# Patient Record
Sex: Male | Born: 1965 | Race: Black or African American | Hispanic: No | Marital: Married | Smoking: Current some day smoker
Health system: Southern US, Community
[De-identification: ages and names within clinical notes are randomized; demographics above are authoritative.]

## PROBLEM LIST (undated history)

## (undated) DIAGNOSIS — M109 Gout, unspecified: Secondary | ICD-10-CM

## (undated) DIAGNOSIS — I10 Essential (primary) hypertension: Secondary | ICD-10-CM

## (undated) DIAGNOSIS — I82409 Acute embolism and thrombosis of unspecified deep veins of unspecified lower extremity: Secondary | ICD-10-CM

## (undated) DIAGNOSIS — D689 Coagulation defect, unspecified: Secondary | ICD-10-CM

## (undated) DIAGNOSIS — I2699 Other pulmonary embolism without acute cor pulmonale: Secondary | ICD-10-CM

## (undated) HISTORY — DX: Coagulation defect, unspecified: D68.9

## (undated) HISTORY — PX: TENDON REPAIR: SHX5111

---

## 1998-12-25 ENCOUNTER — Emergency Department (HOSPITAL_COMMUNITY): Admission: EM | Admit: 1998-12-25 | Discharge: 1998-12-25 | Payer: Self-pay | Admitting: Emergency Medicine

## 1998-12-25 ENCOUNTER — Encounter: Payer: Self-pay | Admitting: Emergency Medicine

## 2003-04-08 ENCOUNTER — Emergency Department (HOSPITAL_COMMUNITY): Admission: EM | Admit: 2003-04-08 | Discharge: 2003-04-09 | Payer: Self-pay | Admitting: Emergency Medicine

## 2007-06-11 ENCOUNTER — Emergency Department (HOSPITAL_COMMUNITY): Admission: EM | Admit: 2007-06-11 | Discharge: 2007-06-11 | Payer: Self-pay | Admitting: Emergency Medicine

## 2011-08-07 LAB — URINALYSIS, ROUTINE W REFLEX MICROSCOPIC
Bilirubin Urine: NEGATIVE
Glucose, UA: NEGATIVE
Hgb urine dipstick: NEGATIVE
Ketones, ur: NEGATIVE
Nitrite: NEGATIVE
Protein, ur: NEGATIVE
Specific Gravity, Urine: 1.023
Urobilinogen, UA: 1
pH: 6

## 2011-08-07 LAB — DIFFERENTIAL
Basophils Absolute: 0
Basophils Relative: 0
Eosinophils Absolute: 0.2
Eosinophils Relative: 3
Lymphocytes Relative: 26
Lymphs Abs: 2
Monocytes Absolute: 0.6
Monocytes Relative: 8
Neutro Abs: 4.9
Neutrophils Relative %: 63

## 2011-08-07 LAB — BASIC METABOLIC PANEL
BUN: 7
CO2: 27
Calcium: 9.4
Chloride: 105
Creatinine, Ser: 1.11
GFR calc Af Amer: >60
GFR calc non Af Amer: >60
Glucose, Bld: 120 — ABNORMAL HIGH
Potassium: 4.4
Sodium: 142

## 2011-08-07 LAB — RAPID URINE DRUG SCREEN, HOSP PERFORMED
Amphetamines: NOT DETECTED
Barbiturates: NOT DETECTED
Benzodiazepines: NOT DETECTED
Cocaine: POSITIVE — AB
Opiates: NOT DETECTED
Tetrahydrocannabinol: NOT DETECTED

## 2011-08-07 LAB — CBC
HCT: 44.1
Hemoglobin: 14.9
MCHC: 33.8
MCV: 88.9
Platelets: 288
RBC: 4.96
RDW: 12.8
WBC: 7.7

## 2011-08-07 LAB — ETHANOL: Alcohol, Ethyl (B): 5

## 2012-07-03 ENCOUNTER — Emergency Department (HOSPITAL_BASED_OUTPATIENT_CLINIC_OR_DEPARTMENT_OTHER)
Admission: EM | Admit: 2012-07-03 | Discharge: 2012-07-03 | Disposition: A | Discharge status: Home / Self Care | Payer: BC Managed Care – PPO | Attending: Emergency Medicine | Admitting: Emergency Medicine

## 2012-07-03 ENCOUNTER — Emergency Department (HOSPITAL_BASED_OUTPATIENT_CLINIC_OR_DEPARTMENT_OTHER): Payer: BC Managed Care – PPO

## 2012-07-03 ENCOUNTER — Encounter (HOSPITAL_BASED_OUTPATIENT_CLINIC_OR_DEPARTMENT_OTHER): Payer: Self-pay | Admitting: *Deleted

## 2012-07-03 DIAGNOSIS — R42 Dizziness and giddiness: Secondary | ICD-10-CM | POA: Insufficient documentation

## 2012-07-03 DIAGNOSIS — I10 Essential (primary) hypertension: Secondary | ICD-10-CM

## 2012-07-03 DIAGNOSIS — Z79899 Other long term (current) drug therapy: Secondary | ICD-10-CM | POA: Insufficient documentation

## 2012-07-03 DIAGNOSIS — H538 Other visual disturbances: Secondary | ICD-10-CM | POA: Insufficient documentation

## 2012-07-03 DIAGNOSIS — H81399 Other peripheral vertigo, unspecified ear: Secondary | ICD-10-CM

## 2012-07-03 HISTORY — DX: Essential (primary) hypertension: I10

## 2012-07-03 LAB — CBC WITH DIFFERENTIAL/PLATELET
Basophils Absolute: 0.1 10*3/uL (ref 0.0–0.1)
Eosinophils Absolute: 0.4 10*3/uL (ref 0.0–0.7)
Eosinophils Relative: 5 % (ref 0–5)
HCT: 43.7 % (ref 39.0–52.0)
Lymphocytes Relative: 32 % (ref 12–46)
MCH: 30.3 pg (ref 26.0–34.0)
MCV: 87.1 fL (ref 78.0–100.0)
Monocytes Absolute: 0.7 10*3/uL (ref 0.1–1.0)
Platelets: 226 10*3/uL (ref 150–400)
RDW: 12.5 % (ref 11.5–15.5)
WBC: 8.3 10*3/uL (ref 4.0–10.5)

## 2012-07-03 LAB — BASIC METABOLIC PANEL
CO2: 27 mEq/L (ref 19–32)
Calcium: 9.7 mg/dL (ref 8.4–10.5)
Creatinine, Ser: 1 mg/dL (ref 0.50–1.35)
GFR calc non Af Amer: 89 mL/min — ABNORMAL LOW (ref >90)
Glucose, Bld: 95 mg/dL (ref 70–99)
Sodium: 137 mEq/L (ref 135–145)

## 2012-07-03 MED ORDER — MECLIZINE HCL 25 MG PO TABS
25.0000 mg | ORAL_TABLET | Freq: Once | ORAL | Status: AC
  Administered 2012-07-03: 25 mg via ORAL
  Filled 2012-07-03: qty 1

## 2012-07-03 MED ORDER — MECLIZINE HCL 25 MG PO TABS: 25.0000 mg | ORAL_TABLET | Freq: Three times a day (TID) | ORAL | Status: DC | PRN

## 2012-07-03 NOTE — ED Notes (Signed)
Pt states he has felt like he is "drunk" since Friday, but does not drink. Denies other s/s. No problems with speech, but feels like he is stumbling. This a.m. put tissue in ear and states that helped some.

## 2012-07-03 NOTE — ED Provider Notes (Signed)
History   This chart was scribed for Dione Booze, MD by Sofie Rower. The patient was seen in room MH03/MH03 and the patient's care was started at 5:05PM    CSN: [id number]  Arrival date & time 07/03/12  1400   First MD Initiated Contact with Patient 07/03/12 1705      Chief Complaint  Patient presents with  . Dizziness    (Consider location/radiation/quality/duration/timing/severity/associated sxs/prior treatment) The history is provided by the patient. No language interpreter was used.    Luis Harrington is a 46 y.o. male , with a hx of hypertension, who presents to the Emergency Department complaining of sudden, progressively worsening dizziness, onset two days ago. The pt reports the dizzy sensation he is experiencing is a spinning sensation where he feels as if he is constantly off balance. Modifying factors include placing tissue paper in the ears which provides moderate relief of the dizziness, walking which intensifies the dizziness, certain movements and positions of the head which intensifies the dizziness, and taking tylenol which does not provide relief of the dizziness. The pt denies any ear pain or ringing sensation in the ears.   The pt currently quit smoking one month ago. The pt does not drink alcohol.      Past Medical History  Diagnosis Date  . Hypertension     History reviewed. No pertinent past surgical history.  History reviewed. No pertinent family history.  History  Substance Use Topics  . Smoking status: Never Smoker   . Smokeless tobacco: Not on file  . Alcohol Use: No      Review of Systems  All other systems reviewed and are negative.    Allergies  Shrimp  Home Medications   Current Outpatient Rx  Name Route Sig Dispense Refill  . VITAMIN C PO Oral Take 1 tablet by mouth daily.    Marland Kitchen VITAMIN D PO Oral Take 1 tablet by mouth daily.    Marland Kitchen GINSENG PO Oral Take 1 tablet by mouth daily.    Marland Kitchen LISINOPRIL-HYDROCHLOROTHIAZIDE 10-12.5 MG PO TABS  Oral Take 1 tablet by mouth daily.    Marland Kitchen ZINC PO Oral Take 1 tablet by mouth daily.    . TETRAHYDROZOLINE HCL 0.05 % OP SOLN Both Eyes Place 1 drop into both eyes daily.      BP 209/125  Pulse 61  Temp 98.1 F (36.7 C) (Oral)  Resp 20  Ht 5\' 11"  (1.803 m)  Wt 218 lb (98.884 kg)  BMI 30.40 kg/m2  SpO2 100%  Physical Exam  Nursing note and vitals reviewed. Constitutional: He is oriented to person, place, and time. He appears well-developed and well-nourished.  HENT:  Head: Atraumatic.  Nose: Nose normal.  Eyes: Conjunctivae and EOM are normal.  Neck: Normal range of motion. Neck supple.  Cardiovascular: Normal rate, regular rhythm and normal heart sounds.        No carotid bruis.   Pulmonary/Chest: Effort normal and breath sounds normal.  Abdominal: Soft. Bowel sounds are normal.  Musculoskeletal: Normal range of motion.  Neurological: He is alert and oriented to person, place, and time.       On Romberg test, he is unsteady and tends to fall to his right. Dizziness is partially reproduced by vestibular stimulation.  Skin: Skin is warm and dry.  Psychiatric: He has a normal mood and affect. His behavior is normal.    ED Course  Procedures (including critical care time)  DIAGNOSTIC STUDIES: Oxygen Saturation is 100% on room air, normal  by my interpretation.    COORDINATION OF CARE:   5:16PM- Blood work, CT scan, vertigo management, and possible MRI scan discussed. Pt agrees to treatment.   8:06PM- Recheck. Laboratory and radiology results discussed. Pt agrees to treatment.   Results for orders placed during the hospital encounter of 07/03/12  CBC WITH DIFFERENTIAL      Component Value Range   WBC 8.3  4.0 - 10.5 K/uL   RBC 5.02  4.22 - 5.81 MIL/uL   Hemoglobin 15.2  13.0 - 17.0 g/dL   HCT 96.0  45.4 - 09.8 %   MCV 87.1  78.0 - 100.0 fL   MCH 30.3  26.0 - 34.0 pg   MCHC 34.8  30.0 - 36.0 g/dL   RDW 11.9  14.7 - 82.9 %   Platelets 226  150 - 400 K/uL   Neutrophils  Relative 54  43 - 77 %   Neutro Abs 4.5  1.7 - 7.7 K/uL   Lymphocytes Relative 32  12 - 46 %   Lymphs Abs 2.6  0.7 - 4.0 K/uL   Monocytes Relative 8  3 - 12 %   Monocytes Absolute 0.7  0.1 - 1.0 K/uL   Eosinophils Relative 5  0 - 5 %   Eosinophils Absolute 0.4  0.0 - 0.7 K/uL   Basophils Relative 1  0 - 1 %   Basophils Absolute 0.1  0.0 - 0.1 K/uL  BASIC METABOLIC PANEL      Component Value Range   Sodium 137  135 - 145 mEq/L   Potassium 4.2  3.5 - 5.1 mEq/L   Chloride 99  96 - 112 mEq/L   CO2 27  19 - 32 mEq/L   Glucose, Bld 95  70 - 99 mg/dL   BUN 12  6 - 23 mg/dL   Creatinine, Ser 5.62  0.50 - 1.35 mg/dL   Calcium 9.7  8.4 - 13.0 mg/dL   GFR calc non Af Amer 89 (*) >90 mL/min   GFR calc Af Amer >90  >90 mL/min   Ct Head Wo Contrast  07/03/2012  *RADIOLOGY REPORT*  Clinical Data:  Dizziness, nausea and blurred vision.  History of hypertension.  CT HEAD WITHOUT CONTRAST  Technique: Contiguous axial images were obtained from the base of the skull through the vertex without contrast.  Comparison: None.  Findings: Normal appearing cerebral hemispheres and posterior fossa structures.  Normal size and position of the ventricles.  No intracranial hemorrhage, mass lesion or evidence of acute infarction.  Unremarkable bones and included portions of the paranasal sinuses.  IMPRESSION: Normal examination.   Original Report Authenticated By: Darrol Angel, M.D.          1. Peripheral vertigo   2. Hypertension       MDM  Vertigo. It is unclear whether his vertigo is peripheral or central. He has components of both. CT scan will be obtained and if negative, consideration will be given to transferral to [facility] to get an MRI scan. Also of concern is that his hypertension is poorly controlled.   Reexam after oral meclizine, he feels much better. Romberg testing was repeated and is now completely normal. At this point, I do not see an indication for MRI scanning. He'll be sent  home with prescription for meclizine.   I personally performed the services described in this documentation, which was scribed in my presence. The recorded information has been reviewed and considered.      Onalee Hua  Preston Fleeting, MD 07/03/12 2011

## 2012-07-05 ENCOUNTER — Encounter (HOSPITAL_BASED_OUTPATIENT_CLINIC_OR_DEPARTMENT_OTHER): Payer: Self-pay

## 2012-07-05 ENCOUNTER — Emergency Department (HOSPITAL_BASED_OUTPATIENT_CLINIC_OR_DEPARTMENT_OTHER)
Admission: EM | Admit: 2012-07-05 | Discharge: 2012-07-06 | Disposition: A | Discharge status: Home / Self Care | Payer: BC Managed Care – PPO | Attending: Emergency Medicine | Admitting: Emergency Medicine

## 2012-07-05 DIAGNOSIS — H9319 Tinnitus, unspecified ear: Secondary | ICD-10-CM | POA: Insufficient documentation

## 2012-07-05 DIAGNOSIS — R42 Dizziness and giddiness: Secondary | ICD-10-CM | POA: Insufficient documentation

## 2012-07-05 DIAGNOSIS — H81399 Other peripheral vertigo, unspecified ear: Secondary | ICD-10-CM

## 2012-07-05 DIAGNOSIS — I10 Essential (primary) hypertension: Secondary | ICD-10-CM | POA: Insufficient documentation

## 2012-07-05 NOTE — ED Notes (Signed)
Pt right ear is swollen and is red inside.

## 2012-07-05 NOTE — ED Notes (Signed)
C/o ringing in right ear, dizziness x 5 days-was seen here [facility] Link for same

## 2012-07-05 NOTE — ED Provider Notes (Signed)
History     CSN: [id number] Arrival date & time 07/05/12  2217 First MD Initiated Contact with Patient 07/05/12 2342      Chief Complaint  Patient presents with  . Tinnitus    HPI Pt has been having issues for the last week.  He thinks he has an ear infection.  PT denies any pain.  He has been having trouble with ringing in his ears and some issues with his balance.  Pt has had some nasal congestion and drainage down the back of his throat.  He was seen in the ED about on Sunday and had an evaluation including a CT scan.  It was normal and he was treated for vertigo.  Pt has been taking the meclizine but is not sure if it helps.  No trouble with speech or coordination although his balance does feel off.  The room is no longer spinning but it was earlier.  His balance has been better since the medications. Past Medical History  Diagnosis Date  . Hypertension     History reviewed. No pertinent past surgical history.  No family history on file.  History  Substance Use Topics  . Smoking status: Never Smoker   . Smokeless tobacco: Not on file  . Alcohol Use: No      Review of Systems  Allergies  Shrimp  Home Medications   Current Outpatient Rx  Name Route Sig Dispense Refill  . VITAMIN C PO Oral Take 1 tablet by mouth daily.    Marland Kitchen VITAMIN D PO Oral Take 1 tablet by mouth daily.    Marland Kitchen GINSENG PO Oral Take 1 tablet by mouth daily.    Marland Kitchen LISINOPRIL-HYDROCHLOROTHIAZIDE 10-12.5 MG PO TABS Oral Take 1 tablet by mouth daily.    Marland Kitchen MECLIZINE HCL 25 MG PO TABS Oral Take 25 mg by mouth 3 (three) times daily as needed. For vertigo.    Marland Kitchen ZINC PO Oral Take 1 tablet by mouth daily.    . TETRAHYDROZOLINE HCL 0.05 % OP SOLN Both Eyes Place 1 drop into both eyes daily.      BP 142/100  Pulse 88  Temp 98.2 F (36.8 C) (Oral)  Resp 16  Ht 5\' 11"  (1.803 m)  Wt 218 lb (98.884 kg)  BMI 30.40 kg/m2  SpO2 100%  Physical Exam  Nursing note and vitals reviewed. Constitutional: He is  oriented to person, place, and time. He appears well-developed and well-nourished. No distress.  HENT:  Head: Normocephalic and atraumatic.  Right Ear: Tympanic membrane, external ear and ear canal normal.  Left Ear: Tympanic membrane, external ear and ear canal normal.  Mouth/Throat: Oropharynx is clear and moist.  Eyes: Conjunctivae normal are normal. Right eye exhibits no discharge. Left eye exhibits no discharge. No scleral icterus.  Neck: Neck supple. No tracheal deviation present.  Cardiovascular: Normal rate, regular rhythm and intact distal pulses.   Pulmonary/Chest: Effort normal and breath sounds normal. No stridor. No respiratory distress. He has no wheezes. He has no rales.  Abdominal: Soft. Bowel sounds are normal. He exhibits no distension. There is no tenderness. There is no rebound and no guarding.  Musculoskeletal: He exhibits no edema and no tenderness.  Neurological: He is alert and oriented to person, place, and time. He has normal strength. No cranial nerve deficit ( no gross defecits noted) or sensory deficit. He exhibits normal muscle tone. He displays no seizure activity. Coordination and gait normal.       No pronator drift bilateral  upper extrem, able to hold both legs off bed for 5 seconds, sensation intact in all extremities, no visual field cuts, no left or right sided neglect  Skin: Skin is warm and dry. No rash noted.  Psychiatric: He has a normal mood and affect.    ED Course  Procedures (including critical care time)  Labs Reviewed - No data to display No results found.   1. Peripheral vertigo   2. Tinnitus       MDM  I suspect the patient's symptoms are related to peripheral vertigo.  He has a normal neurologic exam I doubt stroke. I did recommend followup with an ENT doctor for further evaluation.  At this time there does not appear to be any evidence of an acute emergency medical condition and the patient appears stable for discharge with  appropriate outpatient follow up.        Celene Kras, MD 07/06/12 [phone]

## 2013-09-08 IMAGING — CT CT HEAD W/O CM
1 series · 16 of 30 positions shown, 20 images · non-contrast
Comparison: None.

CLINICAL DATA: Dizziness, nausea and blurred vision.  History of
hypertension.

CT HEAD WITHOUT CONTRAST
TECHNIQUE: Contiguous axial images were obtained from the base of
the skull through the vertex without contrast.

[Series 2: head 4.8 h37s · axial · 0.47mm/px · z∈[-136,+20]mm · 16 of 36 slices shown, 20 images]
[im 2/36  brain]
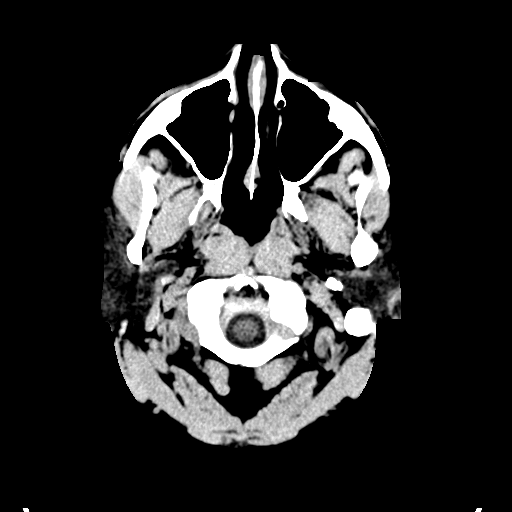
[im 2/36  bone]
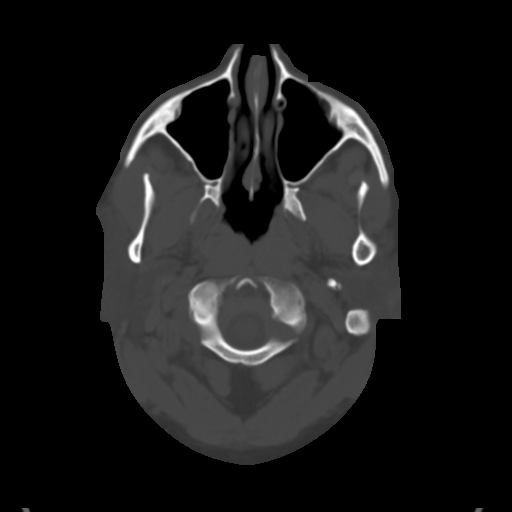
[im 4/36  brain]
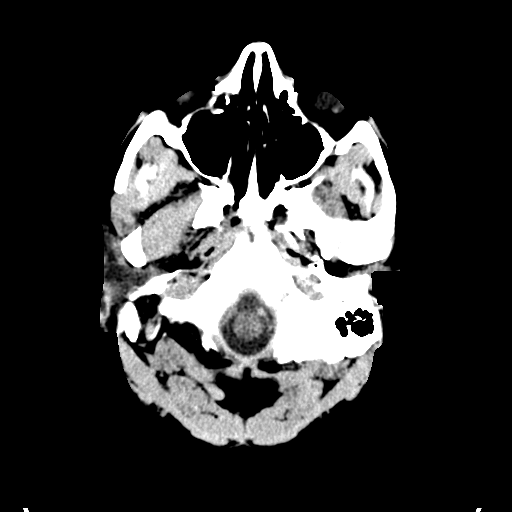
[im 7/36  brain]
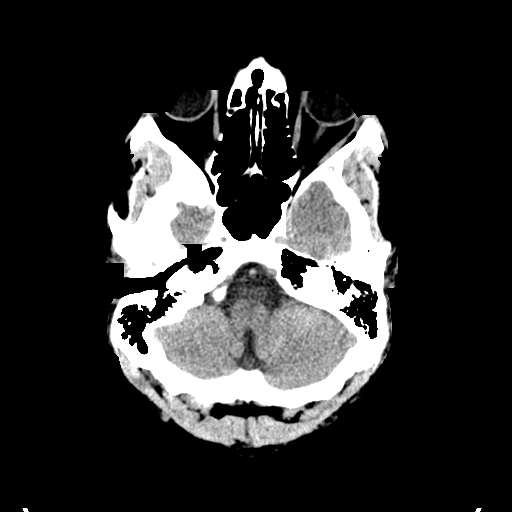
[im 9/36  brain]
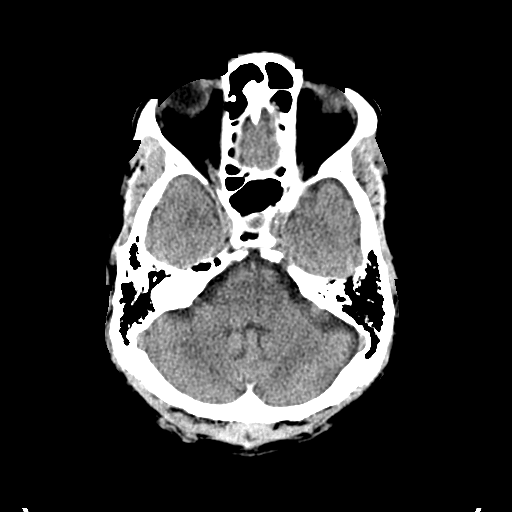
[im 10/36  brain]
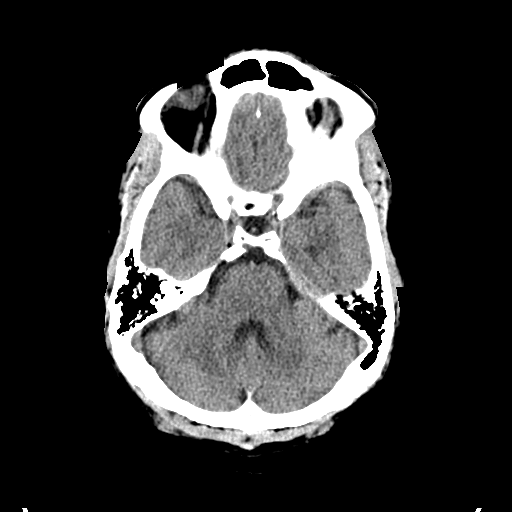
[im 10/36  bone]
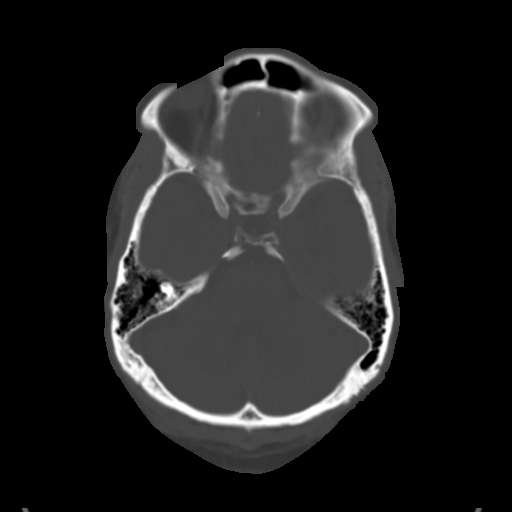
[im 13/36  brain]
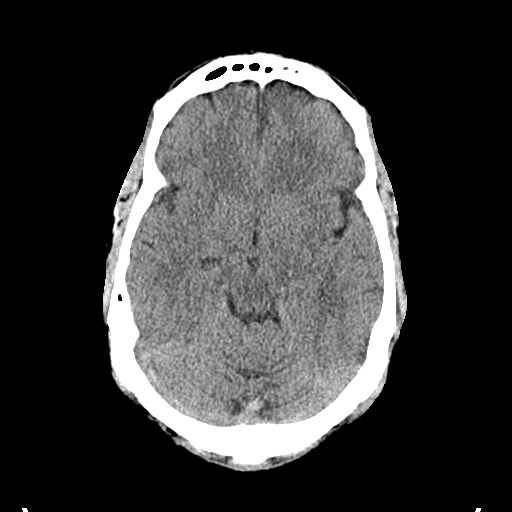
[im 15/36  brain]
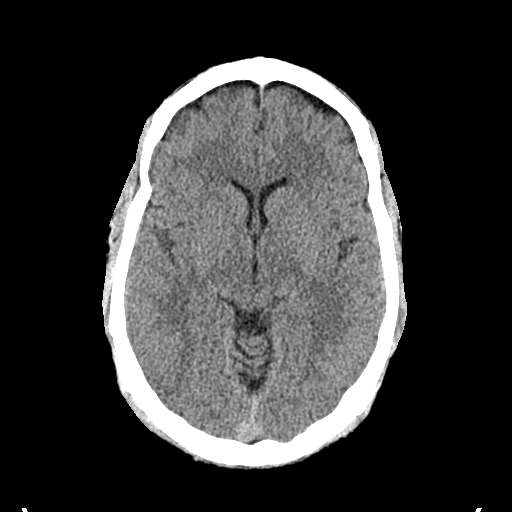
[im 17/36  brain]
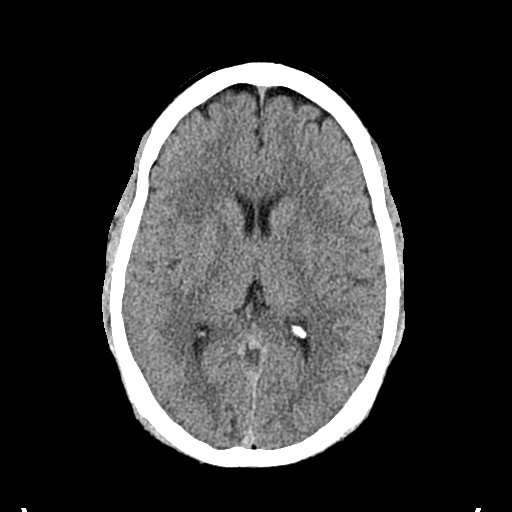
[im 19/36  brain]
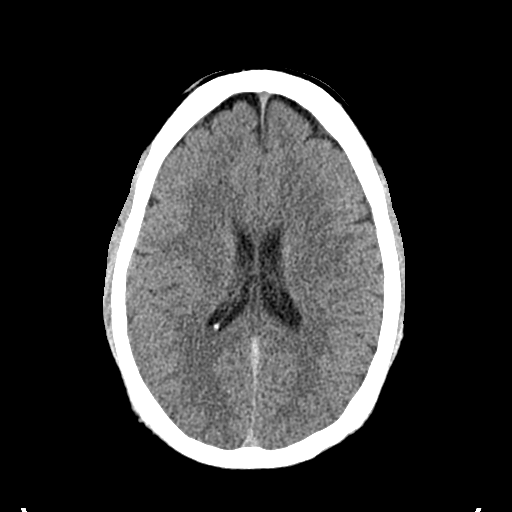
[im 19/36  bone]
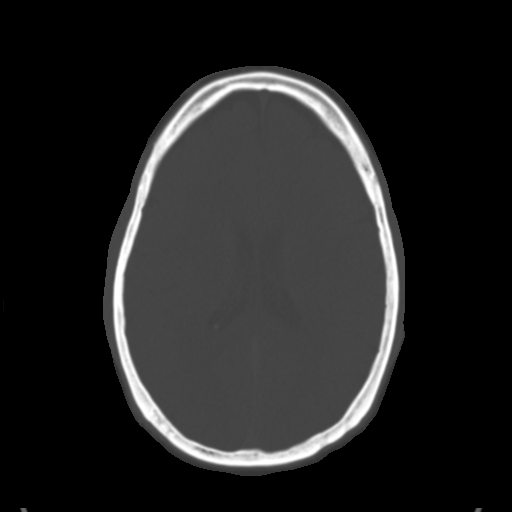
[im 21/36  brain]
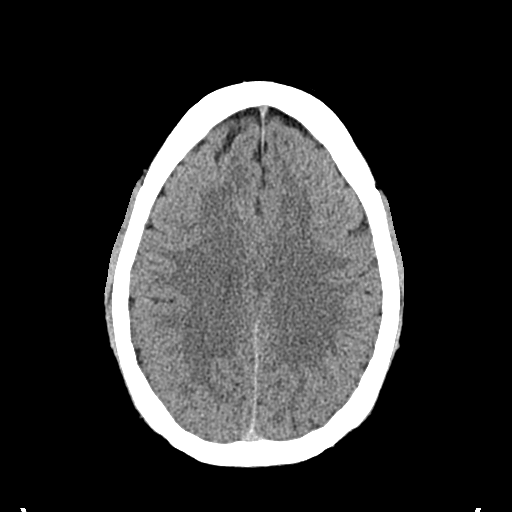
[im 23/36  brain]
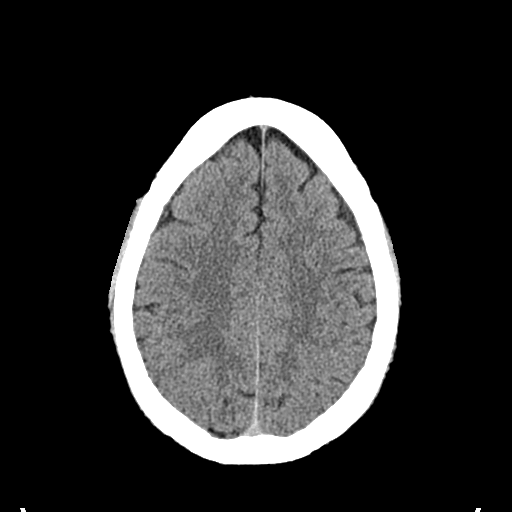
[im 26/36  brain]
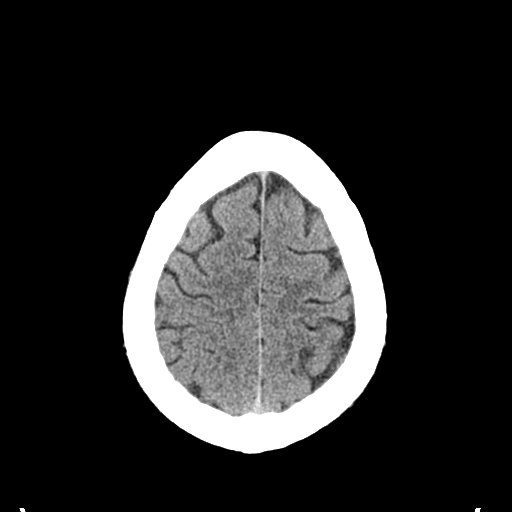
[im 27/36  brain]
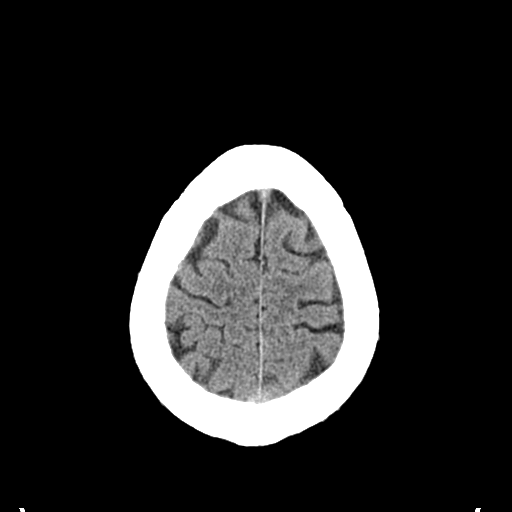
[im 27/36  bone]
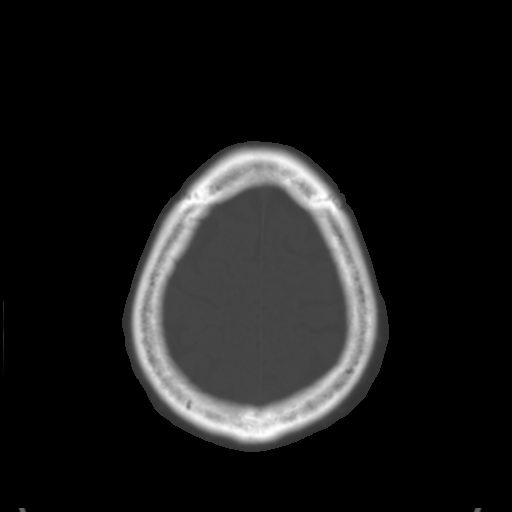
[im 29/36  brain]
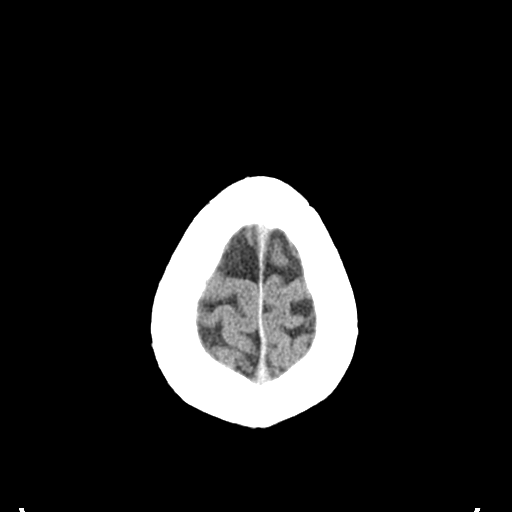
[im 32/36  brain]
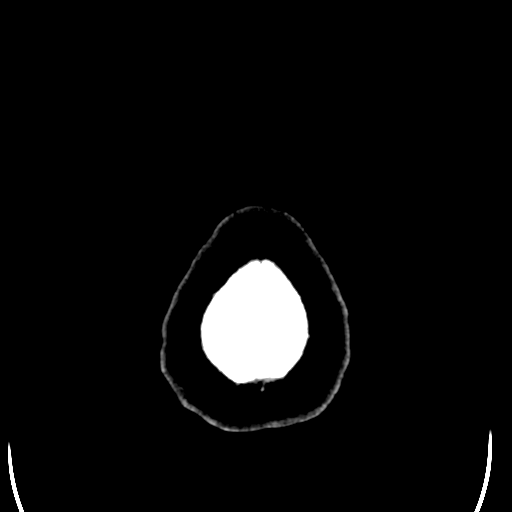
[im 34/36  brain]
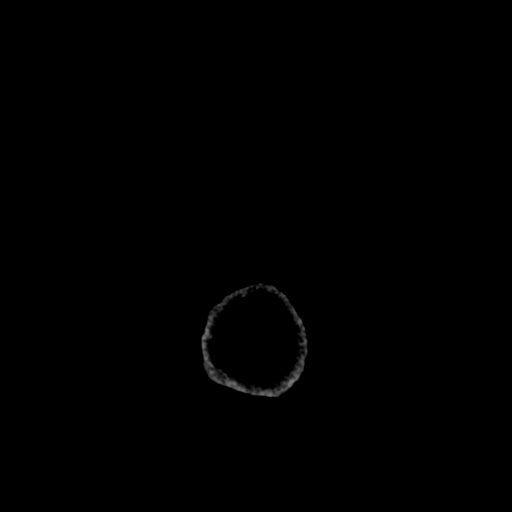

[16 of 30 positions shown; findings below may reference images not displayed]

FINDINGS: Normal appearing cerebral hemispheres and posterior fossa
structures.  Normal size and position of the ventricles.  No
intracranial hemorrhage, mass lesion or evidence of acute
infarction.  Unremarkable bones and included portions of the
paranasal sinuses.
IMPRESSION: Normal examination.

## 2017-01-11 LAB — BASIC METABOLIC PANEL
BUN: 25 — AB (ref 4–21)
Creatinine: 1.7 — AB (ref 0.6–1.3)
GLUCOSE: 118
SODIUM: 135 — AB (ref 137–147)

## 2017-01-11 LAB — CBC AND DIFFERENTIAL: WBC: 7.7

## 2017-01-11 LAB — PSA: PSA: 4.76

## 2018-04-02 ENCOUNTER — Telehealth (HOSPITAL_COMMUNITY): Payer: Self-pay

## 2018-04-02 ENCOUNTER — Ambulatory Visit (HOSPITAL_COMMUNITY)
Admission: EM | Admit: 2018-04-02 | Discharge: 2018-04-02 | Disposition: A | Discharge status: Home / Self Care | Payer: BLUE CROSS/BLUE SHIELD | Attending: Family | Admitting: Family

## 2018-04-02 ENCOUNTER — Other Ambulatory Visit: Payer: Self-pay

## 2018-04-02 ENCOUNTER — Ambulatory Visit (INDEPENDENT_AMBULATORY_CARE_PROVIDER_SITE_OTHER): Payer: BLUE CROSS/BLUE SHIELD

## 2018-04-02 ENCOUNTER — Encounter (HOSPITAL_COMMUNITY): Payer: Self-pay

## 2018-04-02 DIAGNOSIS — M109 Gout, unspecified: Secondary | ICD-10-CM

## 2018-04-02 DIAGNOSIS — M79672 Pain in left foot: Secondary | ICD-10-CM | POA: Diagnosis not present

## 2018-04-02 LAB — POCT I-STAT, CHEM 8
BUN: 13 mg/dL (ref 6–20)
CHLORIDE: 103 mmol/L (ref 101–111)
Calcium, Ion: 1.24 mmol/L (ref 1.15–1.40)
Creatinine, Ser: 1.2 mg/dL (ref 0.61–1.24)
Glucose, Bld: 113 mg/dL — ABNORMAL HIGH (ref 65–99)
HEMATOCRIT: 45 % (ref 39.0–52.0)
HEMOGLOBIN: 15.3 g/dL (ref 13.0–17.0)
POTASSIUM: 4.1 mmol/L (ref 3.5–5.1)
Sodium: 140 mmol/L (ref 135–145)
TCO2: 26 mmol/L (ref 22–32)

## 2018-04-02 MED ORDER — AMLODIPINE BESYLATE 5 MG PO TABS: 5.0000 mg | ORAL_TABLET | Freq: Every day | ORAL | 0 refills | Status: DC

## 2018-04-02 MED ORDER — COLCHICINE 0.6 MG PO TABS: ORAL_TABLET | ORAL | 0 refills | Status: DC

## 2018-04-02 NOTE — ED Triage Notes (Signed)
Pt presents today with left foot pain that has been going on since Thursday. States that it is swollen and throbbing and he thinks it is gout. Has never had gout before. State that earlier during the week he did step on a vacuum at the car wash and twisted ankle but it did not bother him.

## 2018-04-02 NOTE — ED Provider Notes (Signed)
[facility]    CSN: [id number] Arrival date & time: 04/02/18  1758     History   Chief Complaint Chief Complaint  Patient presents with  . Foot Pain    HPI Luis Harrington is a 52 y.o. male.   Chief complaint of left foot pain since Thursday, unchanged. Painful to walk. Pain occurred sudden on Thursday. all the sudden pain.   No rash, laceration,  Leg swelling.  6 days ago twisted left ankle on vacuum, however pain at first after incident, then resolved.   Drinks small bottle brandy per day. Tried celery , black berry juice without relief. No history of gout   HTN- Not taking lisinopril-hctz for couple of months. Tried to treat HTN holistically. Takes garlic , ginseng and felt like blood pressure was controlled. Top number runs in 160s.  Denies exertional chest pain or pressure, numbness or tingling radiating to left arm or jaw, palpitations, dizziness, frequent headaches, changes in vision, or shortness of breath.       Past Medical History:  Diagnosis Date  . Hypertension     There are no active problems to display for this patient.   History reviewed. No pertinent surgical history.     Home Medications    Prior to Admission medications   Medication Sig Start Date End Date Taking? Authorizing Provider  GINSENG PO Take 1 tablet by mouth daily.   Yes [provider]  amLODipine (NORVASC) 5 MG tablet Take 1 tablet (5 mg total) by mouth daily. 04/02/18   Allegra Grana, FNP  Ascorbic Acid (VITAMIN C PO) Take 1 tablet by mouth daily.    [provider]  Cholecalciferol (VITAMIN D PO) Take 1 tablet by mouth daily.    [provider]  colchicine 0.6 MG tablet Take 1.2 mg PO, one hour later by another 0.6 mg on first day; then 0.6 mg twice daily until attack resolved.1 04/02/18   Allegra Grana, FNP  Multiple Vitamins-Minerals (ZINC PO) Take 1 tablet by mouth daily.    [provider]  tetrahydrozoline (VISINE) 0.05 %  ophthalmic solution Place 1 drop into both eyes daily.    [provider]    Family History History reviewed. No pertinent family history.  Social History Social History   Tobacco Use  . Smoking status: Never Smoker  Substance Use Topics  . Alcohol use: No  . Drug use: No     Allergies   Shrimp [shellfish allergy]   Review of Systems Review of Systems  Constitutional: Negative for chills and fever.  Eyes: Negative for visual disturbance.  Respiratory: Negative for cough and shortness of breath.   Cardiovascular: Negative for chest pain and palpitations.  Gastrointestinal: Negative for nausea and vomiting.  Musculoskeletal: Negative for joint swelling.  Skin: Negative for rash and wound.  Neurological: Negative for dizziness and headaches.     Physical Exam Triage Vital Signs ED Triage Vitals  Enc Vitals Group     BP 04/02/18 1828 (!) 241/144     Pulse Rate 04/02/18 1828 96     Resp 04/02/18 1828 16     Temp 04/02/18 1828 99.3 F (37.4 C)     Temp Source 04/02/18 1828 Oral     SpO2 04/02/18 1828 100 %     Weight --      Height --      Head Circumference --      Peak Flow --      Pain Score 04/02/18  1825 8     Pain Loc --      Pain Edu? --      Excl. in GC? --    No data found.  Updated Vital Signs BP (!) 241/144 (BP Location: Left Arm)   Pulse 96   Temp 99.3 F (37.4 C) (Oral)   Resp 16   SpO2 100%   Visual Acuity Right Eye Distance:   Left Eye Distance:   Bilateral Distance:    Right Eye Near:   Left Eye Near:    Bilateral Near:     Physical Exam  Constitutional: He appears well-developed and well-nourished.  HENT:  Right Ear: Hearing normal.  Left Ear: Hearing normal.  Mouth/Throat: Uvula is midline, oropharynx is clear and moist and mucous membranes are normal. No posterior oropharyngeal edema or posterior oropharyngeal erythema.  Eyes: Pupils are equal, round, and reactive to light. Conjunctivae, EOM and lids are normal. Lids  are everted and swept, no foreign bodies found.  Normal fundus bilaterally.  Cardiovascular: Regular rhythm and normal heart sounds.  No LE edema, palpable cords or masses. No erythema or increased warmth. No asymmetry in calf size when compared bilaterally LE hair growth symmetric and present. No discoloration of varicosities noted. LE warm and palpable pedal pulses.   Pulmonary/Chest: Effort normal and breath sounds normal. No respiratory distress. He has no wheezes. He has no rhonchi. He has no rales.  Musculoskeletal:       Left ankle: He exhibits normal range of motion, no swelling and no ecchymosis. No tenderness.       Feet:  Severe point tenderness proximal to great toe. No erythema, increased warmth, edema. SKin intact. NO rash  Lymphadenopathy:       Head (right side): No submental, no submandibular, no tonsillar, no preauricular, no posterior auricular and no occipital adenopathy present.       Head (left side): No submental, no submandibular, no tonsillar, no preauricular, no posterior auricular and no occipital adenopathy present.    He has no cervical adenopathy.  Neurological: He is alert. He has normal strength. No cranial nerve deficit or sensory deficit. He displays a negative Romberg sign.  Reflex Scores:      Bicep reflexes are 2+ on the right side and 2+ on the left side.      Patellar reflexes are 2+ on the right side and 2+ on the left side. Grip equal and strong bilateral upper extremities. Gait strong and steady. Able to perform rapid alternating movement without difficulty.  Skin: Skin is warm and dry.  Psychiatric: He has a normal mood and affect. His speech is normal and behavior is normal.  Vitals reviewed.    UC Treatments / Results  Labs (all labs ordered are listed, but only abnormal results are displayed) Labs Reviewed  POCT I-STAT, CHEM 8 - Abnormal; Notable for the following components:      Result Value   Glucose, Bld 113 (*)    All other  components within normal limits    EKG None  Radiology Dg Foot Complete Left  Result Date: 04/02/2018 CLINICAL DATA:  Twisted left ankle on Monday with pain and swelling. EXAM: LEFT FOOT - COMPLETE 3+ VIEW COMPARISON:  None. FINDINGS: There is no evidence of fracture or dislocation. There is small plantar calcaneal spur. Soft tissues are unremarkable. IMPRESSION: No acute fracture or dislocation. Electronically Signed   By: Sherian Rein M.D.   On: 04/02/2018 19:55    Procedures Procedures (including critical care  time)  Medications Ordered in UC Medications - No data to display  Initial Impression / Assessment and Plan / UC Course  I have reviewed the triage vital signs and the nursing notes.  Pertinent labs & imaging results that were available during my care of the patient were reviewed by me and considered in my medical decision making (see chart for details).      Final Clinical Impressions(s) / UC Diagnoses   Final diagnoses:  Gout of left foot, unspecified cause, unspecified chronicity  No signs or symptoms of hypertensive urgency or emergency at this time.  Reassured by neurologic exam.  Patient has been off her blood pressure medications for couple months.  Long discussion with patient about his risk for stroke and heart attack this evening.  He agreed to start amlodipine and he will reestablish with primary care. Based on sudden onset, alcohol use, working diagnosis of gout. No fracture seen on XR left foot.  Start colchicine. Given crutches. Declines medication for pain.  Return precautions given.      Discharge Instructions     Suspect gout.  Concern for blood pressure; very important for you to start amlodipine. I discontinued the lisinopril/hctz since you are not taking Monitor blood pressure,  Goal is less than 130/80; if persistently higher, please follow with primary care ( need to establish) for medication adjustment Low salt Any concern for heart attack  or stroke as we discussed, please call 911 or go directly to emergency room.      ED Prescriptions    Medication Sig Dispense Auth. Provider   amLODipine (NORVASC) 5 MG tablet Take 1 tablet (5 mg total) by mouth daily. 90 tablet Allegra GranaArnett, Alin Chavira G, FNP   colchicine 0.6 MG tablet Take 1.2 mg PO, one hour later by another 0.6 mg on first day; then 0.6 mg twice daily until attack resolved.1 11 tablet Allegra GranaArnett, Shianne Zeiser G, FNP     Controlled Substance Prescriptions [place] Controlled Substance Registry consulted? Not Applicable   Allegra Granarnett, Carlyann Placide G, FNP 04/02/18 2011

## 2018-04-02 NOTE — Discharge Instructions (Addendum)
Suspect gout.  Concern for blood pressure; very important for you to start amlodipine. I discontinued the lisinopril/hctz since you are not taking Monitor blood pressure,  Goal is less than 130/80; if persistently higher, please follow with primary care ( need to establish) for medication adjustment Low salt Any concern for heart attack or stroke as we discussed, please call 911 or go directly to emergency room.

## 2018-04-02 NOTE — ED Notes (Addendum)
fnp aware of blood pressure.  Pt denies any symptoms and reports being off his medication for a long time. Medication refilled and patient reports he is going to get it and take it as soon as he gets home. Encouraged patient to go to the Emergency Department for any numbness, weakness, severe headache, blurry vision or trouble with speech. Patient reports plan to follow up with a PCP

## 2018-04-05 ENCOUNTER — Ambulatory Visit: Payer: Self-pay | Admitting: Podiatry

## 2018-04-06 ENCOUNTER — Encounter: Payer: Self-pay | Admitting: Family Medicine

## 2018-04-06 ENCOUNTER — Ambulatory Visit: Payer: BLUE CROSS/BLUE SHIELD | Admitting: Family Medicine

## 2018-04-06 VITALS — BP 138/88 | HR 82 | Temp 98.5°F | Ht 71.0 in | Wt 230.0 lb

## 2018-04-06 DIAGNOSIS — I10 Essential (primary) hypertension: Secondary | ICD-10-CM | POA: Diagnosis not present

## 2018-04-06 DIAGNOSIS — Z1211 Encounter for screening for malignant neoplasm of colon: Secondary | ICD-10-CM

## 2018-04-06 DIAGNOSIS — Z13220 Encounter for screening for lipoid disorders: Secondary | ICD-10-CM | POA: Diagnosis not present

## 2018-04-06 DIAGNOSIS — R739 Hyperglycemia, unspecified: Secondary | ICD-10-CM

## 2018-04-06 DIAGNOSIS — M109 Gout, unspecified: Secondary | ICD-10-CM

## 2018-04-06 LAB — LIPID PANEL
CHOLESTEROL: 186 mg/dL (ref 0–200)
HDL: 30.6 mg/dL — AB (ref >39.00)
LDL CALC: 128 mg/dL — AB (ref 0–99)
NonHDL: 155.84
Total CHOL/HDL Ratio: 6
Triglycerides: 139 mg/dL (ref 0.0–149.0)
VLDL: 27.8 mg/dL (ref 0.0–40.0)

## 2018-04-06 LAB — COMPREHENSIVE METABOLIC PANEL
ALT: 34 U/L (ref 0–53)
AST: 29 U/L (ref 0–37)
Albumin: 4.4 g/dL (ref 3.5–5.2)
Alkaline Phosphatase: 72 U/L (ref 39–117)
BILIRUBIN TOTAL: 0.5 mg/dL (ref 0.2–1.2)
BUN: 12 mg/dL (ref 6–23)
CALCIUM: 10.2 mg/dL (ref 8.4–10.5)
CO2: 29 meq/L (ref 19–32)
Chloride: 101 mEq/L (ref 96–112)
Creatinine, Ser: 1.04 mg/dL (ref 0.40–1.50)
GFR: 96.63 mL/min (ref >60.00)
GLUCOSE: 127 mg/dL — AB (ref 70–99)
Potassium: 4.6 mEq/L (ref 3.5–5.1)
Sodium: 137 mEq/L (ref 135–145)
Total Protein: 7.9 g/dL (ref 6.0–8.3)

## 2018-04-06 LAB — CBC
HCT: 45.3 % (ref 39.0–52.0)
HEMOGLOBIN: 15.1 g/dL (ref 13.0–17.0)
MCHC: 33.3 g/dL (ref 30.0–36.0)
MCV: 93.4 fl (ref 78.0–100.0)
Platelets: 309 10*3/uL (ref 150.0–400.0)
RBC: 4.85 Mil/uL (ref 4.22–5.81)
RDW: 12.9 % (ref 11.5–15.5)
WBC: 6.2 10*3/uL (ref 4.0–10.5)

## 2018-04-06 LAB — URIC ACID: URIC ACID, SERUM: 5.9 mg/dL (ref 4.0–7.8)

## 2018-04-06 LAB — HEMOGLOBIN A1C: Hgb A1c MFr Bld: 5.7 % (ref 4.6–6.5)

## 2018-04-06 LAB — TSH: TSH: 1.3 u[IU]/mL (ref 0.35–4.50)

## 2018-04-06 NOTE — Patient Instructions (Signed)
It was very nice to see you today!  I am glad you are feeling better!  We will check blood work today.  Keep an eye on your blood pressure and let me know if your pressure is persistently 140/90 or higher.   Come back to see me in 3-6 months, or sooner as needed.  Take care, Dr Jimmey Ralph   [facility] Eating Plan DASH stands for "Dietary Approaches to Stop Hypertension." The DASH eating plan is a healthy eating plan that has been shown to reduce high blood pressure (hypertension). It may also reduce your risk for type 2 diabetes, heart disease, and stroke. The DASH eating plan may also help with weight loss. What are tips for following this plan? General guidelines  Avoid eating more than 2,300 mg (milligrams) of salt (sodium) a day. If you have hypertension, you may need to reduce your sodium intake to 1,500 mg a day.  Limit alcohol intake to no more than 1 drink a day for nonpregnant women and 2 drinks a day for men. One drink equals 12 oz of beer, 5 oz of wine, or 1 oz of hard liquor.  Work with your health care provider to maintain a healthy body weight or to lose weight. Ask what an ideal weight is for you.  Get at least 30 minutes of exercise that causes your heart to beat faster (aerobic exercise) most days of the week. Activities may include walking, swimming, or biking.  Work with your health care provider or diet and nutrition specialist (dietitian) to adjust your eating plan to your individual calorie needs. Reading food labels  Check food labels for the amount of sodium per serving. Choose foods with less than 5 percent of the Daily Value of sodium. Generally, foods with less than 300 mg of sodium per serving fit into this eating plan.  To find whole grains, look for the word "whole" as the first word in the ingredient list. Shopping  Buy products labeled as "low-sodium" or "no salt added."  Buy fresh foods. Avoid canned foods and premade or frozen meals. Cooking  Avoid  adding salt when cooking. Use salt-free seasonings or herbs instead of table salt or sea salt. Check with your health care provider or pharmacist before using salt substitutes.  Do not fry foods. Cook foods using healthy methods such as baking, boiling, grilling, and broiling instead.  Cook with heart-healthy oils, such as olive, canola, soybean, or sunflower oil. Meal planning   Eat a balanced diet that includes: ? 5 or more servings of fruits and vegetables each day. At each meal, try to fill half of your plate with fruits and vegetables. ? Up to 6-8 servings of whole grains each day. ? Less than 6 oz of lean meat, poultry, or fish each day. A 3-oz serving of meat is about the same size as a deck of cards. One egg equals 1 oz. ? 2 servings of low-fat dairy each day. ? A serving of nuts, seeds, or beans 5 times each week. ? Heart-healthy fats. Healthy fats called Omega-3 fatty acids are found in foods such as flaxseeds and coldwater fish, like sardines, salmon, and mackerel.  Limit how much you eat of the following: ? Canned or prepackaged foods. ? Food that is high in trans fat, such as fried foods. ? Food that is high in saturated fat, such as fatty meat. ? Sweets, desserts, sugary drinks, and other foods with added sugar. ? Full-fat dairy products.  Do not salt foods  before eating.  Try to eat at least 2 vegetarian meals each week.  Eat more home-cooked food and less restaurant, buffet, and fast food.  When eating at a restaurant, ask that your food be prepared with less salt or no salt, if possible. What foods are recommended? The items listed may not be a complete list. Talk with your dietitian about what dietary choices are best for you. Grains Whole-grain or whole-wheat bread. Whole-grain or whole-wheat pasta. Brown rice. Orpah Cobb. Bulgur. Whole-grain and low-sodium cereals. Pita bread. Low-fat, low-sodium crackers. Whole-wheat flour tortillas. Vegetables Fresh or  frozen vegetables (raw, steamed, roasted, or grilled). Low-sodium or reduced-sodium tomato and vegetable juice. Low-sodium or reduced-sodium tomato sauce and tomato paste. Low-sodium or reduced-sodium canned vegetables. Fruits All fresh, dried, or frozen fruit. Canned fruit in natural juice (without added sugar). Meat and other protein foods Skinless chicken or [place]. Ground chicken or [place]. Pork with fat trimmed off. Fish and seafood. Egg whites. Dried beans, peas, or lentils. Unsalted nuts, nut butters, and seeds. Unsalted canned beans. Lean cuts of beef with fat trimmed off. Low-sodium, lean deli meat. Dairy Low-fat (1%) or fat-free (skim) milk. Fat-free, low-fat, or reduced-fat cheeses. Nonfat, low-sodium ricotta or cottage cheese. Low-fat or nonfat yogurt. Low-fat, low-sodium cheese. Fats and oils Soft margarine without trans fats. Vegetable oil. Low-fat, reduced-fat, or light mayonnaise and salad dressings (reduced-sodium). Canola, safflower, olive, soybean, and sunflower oils. Avocado. Seasoning and other foods Herbs. Spices. Seasoning mixes without salt. Unsalted popcorn and pretzels. Fat-free sweets. What foods are not recommended? The items listed may not be a complete list. Talk with your dietitian about what dietary choices are best for you. Grains Baked goods made with fat, such as croissants, muffins, or some breads. Dry pasta or rice meal packs. Vegetables Creamed or fried vegetables. Vegetables in a cheese sauce. Regular canned vegetables (not low-sodium or reduced-sodium). Regular canned tomato sauce and paste (not low-sodium or reduced-sodium). Regular tomato and vegetable juice (not low-sodium or reduced-sodium). Rosita Fire. Olives. Fruits Canned fruit in a light or heavy syrup. Fried fruit. Fruit in cream or butter sauce. Meat and other protein foods Fatty cuts of meat. Ribs. Fried meat. Tomasa Blase. Sausage. Bologna and other processed lunch meats. Salami. Fatback. Hotdogs.  Bratwurst. Salted nuts and seeds. Canned beans with added salt. Canned or smoked fish. Whole eggs or egg yolks. Chicken or [place] with skin. Dairy Whole or 2% milk, cream, and half-and-half. Whole or full-fat cream cheese. Whole-fat or sweetened yogurt. Full-fat cheese. Nondairy creamers. Whipped toppings. Processed cheese and cheese spreads. Fats and oils Butter. Stick margarine. Lard. Shortening. Ghee. Bacon fat. Tropical oils, such as coconut, palm kernel, or palm oil. Seasoning and other foods Salted popcorn and pretzels. Onion salt, garlic salt, seasoned salt, table salt, and sea salt. Worcestershire sauce. Tartar sauce. Barbecue sauce. Teriyaki sauce. Soy sauce, including reduced-sodium. Steak sauce. Canned and packaged gravies. Fish sauce. Oyster sauce. Cocktail sauce. Horseradish that you find on the shelf. Ketchup. Mustard. Meat flavorings and tenderizers. Bouillon cubes. Hot sauce and Tabasco sauce. Premade or packaged marinades. Premade or packaged taco seasonings. Relishes. Regular salad dressings. Where to find more information:  National Heart, Lung, and Blood Institute: PopSteam.is  American Heart Association: www.heart.org Summary  The DASH eating plan is a healthy eating plan that has been shown to reduce high blood pressure (hypertension). It may also reduce your risk for type 2 diabetes, heart disease, and stroke.  With the DASH eating plan, you should limit salt (sodium) intake to 2,300 mg  a day. If you have hypertension, you may need to reduce your sodium intake to 1,500 mg a day.  When on the DASH eating plan, aim to eat more fresh fruits and vegetables, whole grains, lean proteins, low-fat dairy, and heart-healthy fats.  Work with your health care provider or diet and nutrition specialist (dietitian) to adjust your eating plan to your individual calorie needs. This information is not intended to replace advice given to you by your health care provider. Make sure you  discuss any questions you have with your health care provider. Document Released: 10/01/2011 Document Revised: 10/05/2016 Document Reviewed: 10/05/2016 Elsevier Interactive Patient Education  2018 [employer]   Low-Purine Diet Purines are compounds that affect the level of uric acid in your body. A low-purine diet is a diet that is low in purines. Eating a low-purine diet can prevent the level of uric acid in your body from getting too high and causing gout or kidney stones or both. What do I need to know about this diet?  Choose low-purine foods. Examples of low-purine foods are listed in the next section.  Drink plenty of fluids, especially water. Fluids can help remove uric acid from your body. Try to drink 8-16 cups (1.9-3.8 L) a day.  Limit foods high in fat, especially saturated fat, as fat makes it harder for the body to get rid of uric acid. Foods high in saturated fat include pizza, cheese, ice cream, whole milk, fried foods, and gravies. Choose foods that are lower in fat and lean sources of protein. Use olive oil when cooking as it contains healthy fats that are not high in saturated fat.  Limit alcohol. Alcohol interferes with the elimination of uric acid from your body. If you are having a gout attack, avoid all alcohol.  Keep in mind that different people's bodies react differently to different foods. You will probably learn over time which foods do or do not affect you. If you discover that a food tends to cause your gout to flare up, avoid eating that food. You can more freely enjoy foods that do not cause problems. If you have any questions about a food item, talk to your dietitian or health care provider. Which foods are low, moderate, and high in purines? The following is a list of foods that are low, moderate, and high in purines. You can eat any amount of the foods that are low in purines. You may be able to have small amounts of foods that are moderate in purines. Ask your  health care provider how much of a food moderate in purines you can have. Avoid foods high in purines. Grains  Foods low in purines: Enriched white bread, pasta, rice, cake, cornbread, popcorn.  Foods moderate in purines: Whole-grain breads and cereals, wheat germ, bran, oatmeal. Uncooked oatmeal. Dry wheat bran or wheat germ.  Foods high in purines: Pancakes, [place] toast, biscuits, muffins. Vegetables  Foods low in purines: All vegetables, except those that are moderate in purines.  Foods moderate in purines: Asparagus, cauliflower, spinach, mushrooms, green peas. Fruits  All fruits are low in purines. Meats and other Protein Foods  Foods low in purines: Eggs, nuts, peanut butter.  Foods moderate in purines: 80-90% lean beef, lamb, veal, pork, poultry, fish, eggs, peanut butter, nuts. Crab, lobster, oysters, and shrimp. Cooked dried beans, peas, and lentils.  Foods high in purines: Anchovies, sardines, herring, mussels, tuna, codfish, scallops, trout, and haddock. Tomasa BlaseBacon. Organ meats (such as liver or kidney). Tripe. Game  meat. Goose. Sweetbreads. Dairy  All dairy foods are low in purines. Low-fat and fat-free dairy products are best because they are low in saturated fat. Beverages  Drinks low in purines: Water, carbonated beverages, tea, coffee, cocoa.  Drinks moderate in purines: Soft drinks and other drinks sweetened with high-fructose corn syrup. Juices. To find whether a food or drink is sweetened with high-fructose corn syrup, look at the ingredients list.  Drinks high in purines: Alcoholic beverages (such as beer). Condiments  Foods low in purines: Salt, herbs, olives, pickles, relishes, vinegar.  Foods moderate in purines: Butter, margarine, oils, mayonnaise. Fats and Oils  Foods low in purines: All types, except gravies and sauces made with meat.  Foods high in purines: Gravies and sauces made with meat. Other Foods  Foods low in purines: Sugars, sweets,  gelatin. Cake. Soups made without meat.  Foods moderate in purines: Meat-based or fish-based soups, broths, or bouillons. Foods and drinks sweetened with high-fructose corn syrup.  Foods high in purines: High-fat desserts (such as ice cream, cookies, cakes, pies, doughnuts, and chocolate). Contact your dietitian for more information on foods that are not listed here. This information is not intended to replace advice given to you by your health care provider. Make sure you discuss any questions you have with your health care provider. Document Released: 02/06/2011 Document Revised: 03/19/2016 Document Reviewed: 09/18/2013 Elsevier Interactive Patient Education  2017 [employer].

## 2018-04-06 NOTE — Assessment & Plan Note (Signed)
At goal.  Continue amlodipine 5 mg daily.  Check CBC, CMET, and TSH.  Follow-up in 3 to 6 months.  Discussed lifestyle modifications including low-salt diet and importance of regular exercise.

## 2018-04-06 NOTE — Assessment & Plan Note (Signed)
Patient deferred starting allopurinol today.  Will check uric acid level.  He will continue with dietary modifications.  Gave a list of low purine foods.  He will follow-up with me in 3 to 6 months.

## 2018-04-06 NOTE — Progress Notes (Signed)
Subjective:  Luis Harrington is a 52 y.o. male who presents today with a chief complaint of gout and to establish care.   HPI:  Gout, new problem Patient started noticing pain in his left foot and ankle about a week ago.  Relates this to being at the beach and eating a lot of red meat.  He has not had any previous history of gout flares.  Went to urgent care 4 days ago and was diagnosed with a gout flare.  He was started on colchicine.  He has had significant improvement in his symptoms, though still has a lot of pain and swelling to the area.  Area was very painful to touch.  Hypertension, New problem Patient with several year history of high blood pressure.  He has been intermittently on blood pressure meds for the past several years.  He was noted to have blood pressure in the 240s of 140s at urgent care 4 days ago.  He was started on amlodipine 5 mg daily and has tolerated well.  ROS: Per HPI, otherwise a complete review of systems was negative.   PMH:  The following were reviewed and entered/updated in epic: Past Medical History:  Diagnosis Date  . Hypertension    Patient Active Problem List   Diagnosis Date Noted  . Gout 04/06/2018  . Essential hypertension 04/06/2018   Past Surgical History:  Procedure Laterality Date  . TENDON REPAIR     Finger    Family History  Problem Relation Age of Onset  . Diabetes Mother   . Hypertension Mother   . Diabetes Father   . Hypertension Father     Medications- reviewed and updated Current Outpatient Medications  Medication Sig Dispense Refill  . amLODipine (NORVASC) 5 MG tablet Take 1 tablet (5 mg total) by mouth daily. 90 tablet 0  . Ascorbic Acid (VITAMIN C PO) Take 1 tablet by mouth daily.    . Cholecalciferol (VITAMIN D PO) Take 1 tablet by mouth daily.    Marland Kitchen. GINSENG PO Take 1 tablet by mouth daily.    . Multiple Vitamins-Minerals (ZINC PO) Take 1 tablet by mouth daily.    Marland Kitchen. tetrahydrozoline (VISINE) 0.05 % ophthalmic  solution Place 1 drop into both eyes daily.     No current facility-administered medications for this visit.     Allergies-reviewed and updated Allergies  Allergen Reactions  . Shrimp [Shellfish Allergy] Itching    Social History   Socioeconomic History  . Marital status: Married    Spouse name: Not on file  . Number of children: Not on file  . Years of education: Not on file  . Highest education level: Not on file  Occupational History  . Not on file  Social Needs  . Financial resource strain: Not on file  . Food insecurity:    Worry: Not on file    Inability: Not on file  . Transportation needs:    Medical: Not on file    Non-medical: Not on file  Tobacco Use  . Smoking status: Current Every Day Smoker  . Smokeless tobacco: Never Used  Substance and Sexual Activity  . Alcohol use: Yes  . Drug use: No  . Sexual activity: Yes    Partners: Female  Lifestyle  . Physical activity:    Days per week: Not on file    Minutes per session: Not on file  . Stress: Not on file  Relationships  . Social connections:    Talks on phone: Not  on file    Gets together: Not on file    Attends religious service: Not on file    Active member of club or organization: Not on file    Attends meetings of clubs or organizations: Not on file    Relationship status: Not on file  Other Topics Concern  . Not on file  Social History Narrative  . Not on file   Objective:  Physical Exam: BP 138/88 (BP Location: Left Arm, Patient Position: Sitting, Cuff Size: Normal)   Pulse 82   Temp 98.5 F (36.9 C) (Oral)   Ht 5\' 11"  (1.803 m)   Wt 230 lb (104.3 kg)   SpO2 98%   BMI 32.08 kg/m   Gen: NAD, resting comfortably CV: RRR with no murmurs appreciated Pulm: NWOB, CTAB with no crackles, wheezes, or rhonchi GI: Normal bowel sounds present. Soft, Nontender, Nondistended. MSK: No edema, cyanosis, or clubbing noted.  Left foot and ankle with mild effusion and tender to palpation.  DP and PT  pulses 2+ bilaterally. Skin: Warm, dry Neuro: Grossly normal, moves all extremities Psych: Normal affect and thought content  Assessment/Plan:  Gout Patient deferred starting allopurinol today.  Will check uric acid level.  He will continue with dietary modifications.  Gave a list of low purine foods.  He will follow-up with me in 3 to 6 months.  Essential hypertension At goal.  Continue amlodipine 5 mg daily.  Check CBC, CMET, and TSH.  Follow-up in 3 to 6 months.  Discussed lifestyle modifications including low-salt diet and importance of regular exercise.  Hyperglycemia Check A1c today.  Preventative healthcare Order placed for Cologuard today.  Check lipid panel.  Katina Degree. Jimmey Ralph, MD 04/06/2018 10:10 AM

## 2018-04-07 ENCOUNTER — Encounter: Payer: Self-pay | Admitting: Family Medicine

## 2018-04-07 DIAGNOSIS — E785 Hyperlipidemia, unspecified: Secondary | ICD-10-CM | POA: Insufficient documentation

## 2018-04-07 DIAGNOSIS — R739 Hyperglycemia, unspecified: Secondary | ICD-10-CM | POA: Insufficient documentation

## 2018-04-08 ENCOUNTER — Other Ambulatory Visit: Payer: Self-pay

## 2018-04-08 MED ORDER — ALLOPURINOL 100 MG PO TABS: 100.0000 mg | ORAL_TABLET | Freq: Every day | ORAL | 6 refills | Status: DC

## 2018-04-08 MED ORDER — ATORVASTATIN CALCIUM 40 MG PO TABS: 40.0000 mg | ORAL_TABLET | Freq: Every day | ORAL | 5 refills | Status: DC

## 2018-04-12 LAB — COLOGUARD

## 2018-04-13 ENCOUNTER — Encounter: Payer: Self-pay | Admitting: Physical Therapy

## 2018-04-21 ENCOUNTER — Ambulatory Visit: Payer: Self-pay | Admitting: *Deleted

## 2018-04-21 LAB — COLOGUARD: Cologuard: POSITIVE

## 2018-04-21 NOTE — Telephone Encounter (Signed)
'  Luis Harrington' with [facility] called to report positive Cologuard result. Questioning if office received fax this AM.. States if not received by end of day please notify: [phone]

## 2018-04-21 NOTE — Telephone Encounter (Signed)
See note

## 2018-04-22 NOTE — Telephone Encounter (Signed)
Fax was received this morning.

## 2018-04-25 ENCOUNTER — Telehealth: Payer: Self-pay | Admitting: Family Medicine

## 2018-04-25 NOTE — Telephone Encounter (Signed)
Please let patient know his cologuard result was positive. It is probably nothing, but he needs a colonoscopy to further evaluate. Please place referral to GI.  Katina Degreealeb M. Jimmey RalphParker, MD 04/25/2018 2:34 PM

## 2018-04-26 ENCOUNTER — Encounter: Payer: Self-pay | Admitting: Gastroenterology

## 2018-04-26 ENCOUNTER — Other Ambulatory Visit: Payer: Self-pay

## 2018-04-26 DIAGNOSIS — R195 Other fecal abnormalities: Secondary | ICD-10-CM

## 2018-04-26 NOTE — Telephone Encounter (Signed)
Patient notified and referral has been placed. 

## 2018-07-04 ENCOUNTER — Telehealth: Payer: Self-pay

## 2018-07-04 NOTE — Telephone Encounter (Signed)
Patient No Showed for Pre-Visit. A message was left on voice mail to call and reschedule before 5:00 Pm today. If patient doesn't call his procedure will be cancelled per [facility] guidelines.  A no show letter will be mailed if patient does not reschedule.   Janalee Dane, LPN  ( PV )

## 2018-07-05 ENCOUNTER — Encounter: Payer: Self-pay | Admitting: Family Medicine

## 2018-07-12 ENCOUNTER — Encounter: Payer: BLUE CROSS/BLUE SHIELD | Admitting: Gastroenterology

## 2019-06-08 IMAGING — DX DG FOOT COMPLETE 3+V*L*
3 series · 3 of 3 positions shown · non-contrast
Comparison: None.

CLINICAL DATA: Twisted left ankle on [REDACTED] with pain and swelling.

EXAM:
LEFT FOOT - COMPLETE 3+ VIEW

[foot ap]
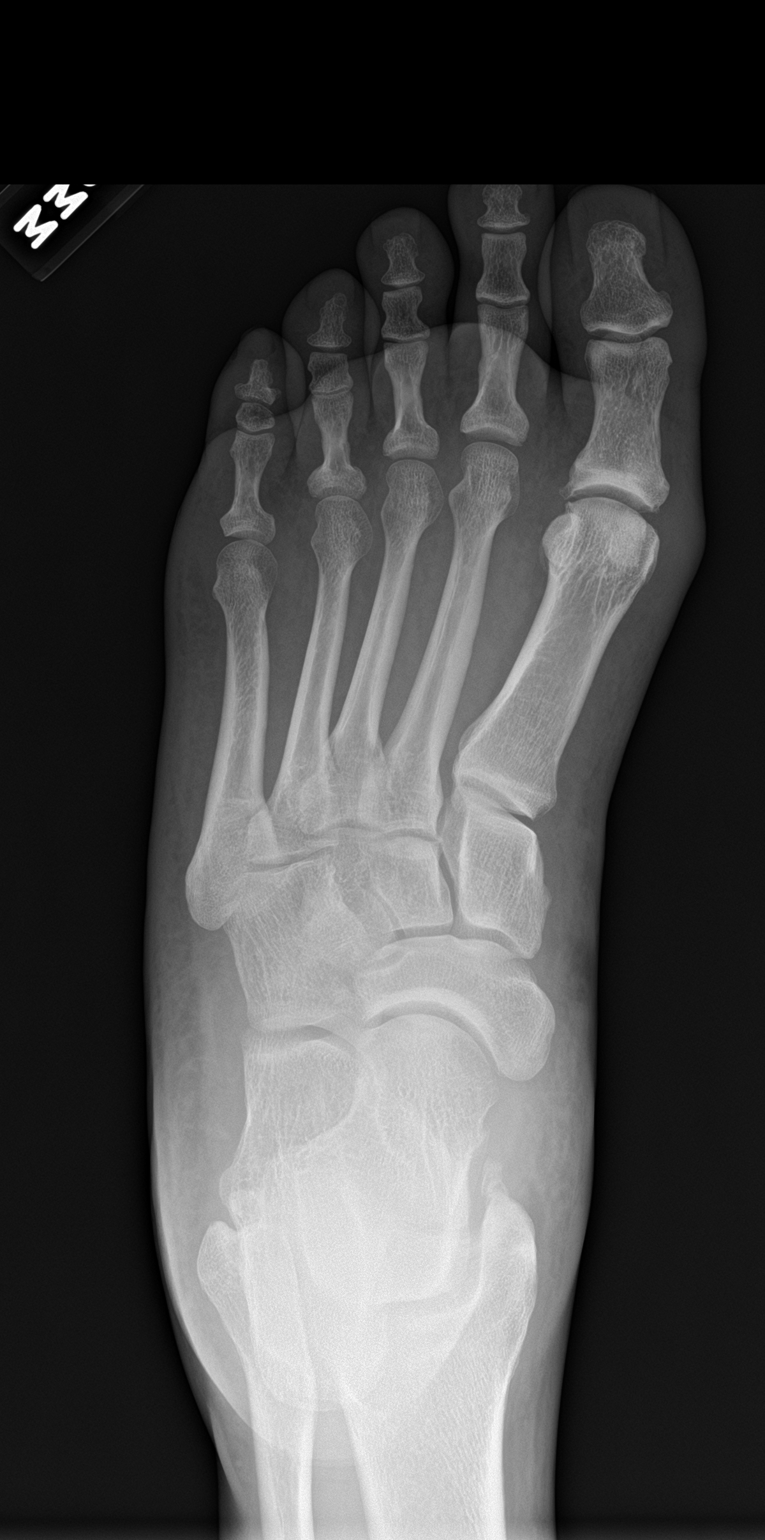

[foot obl]
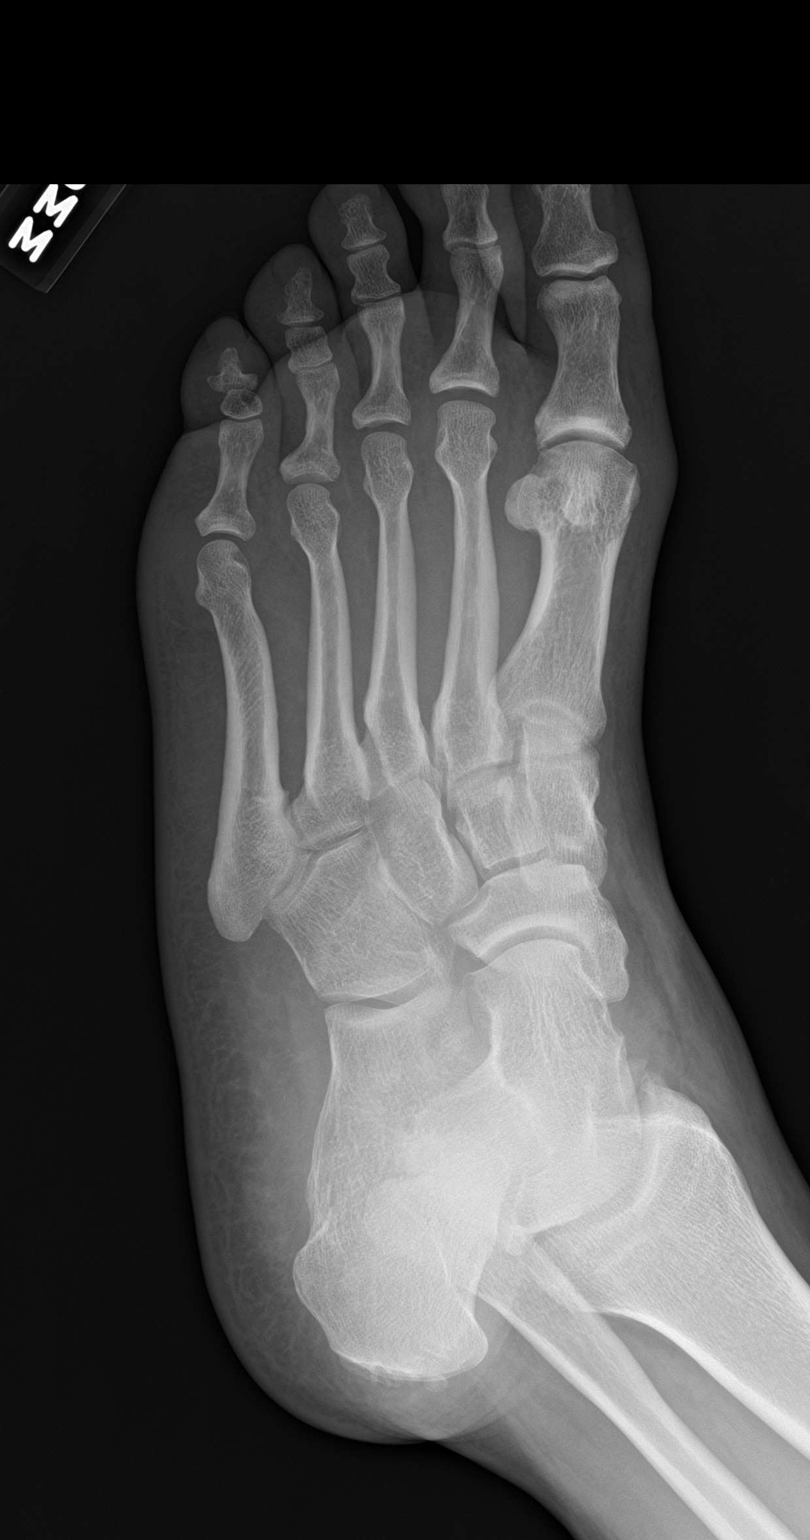

[foot lat]
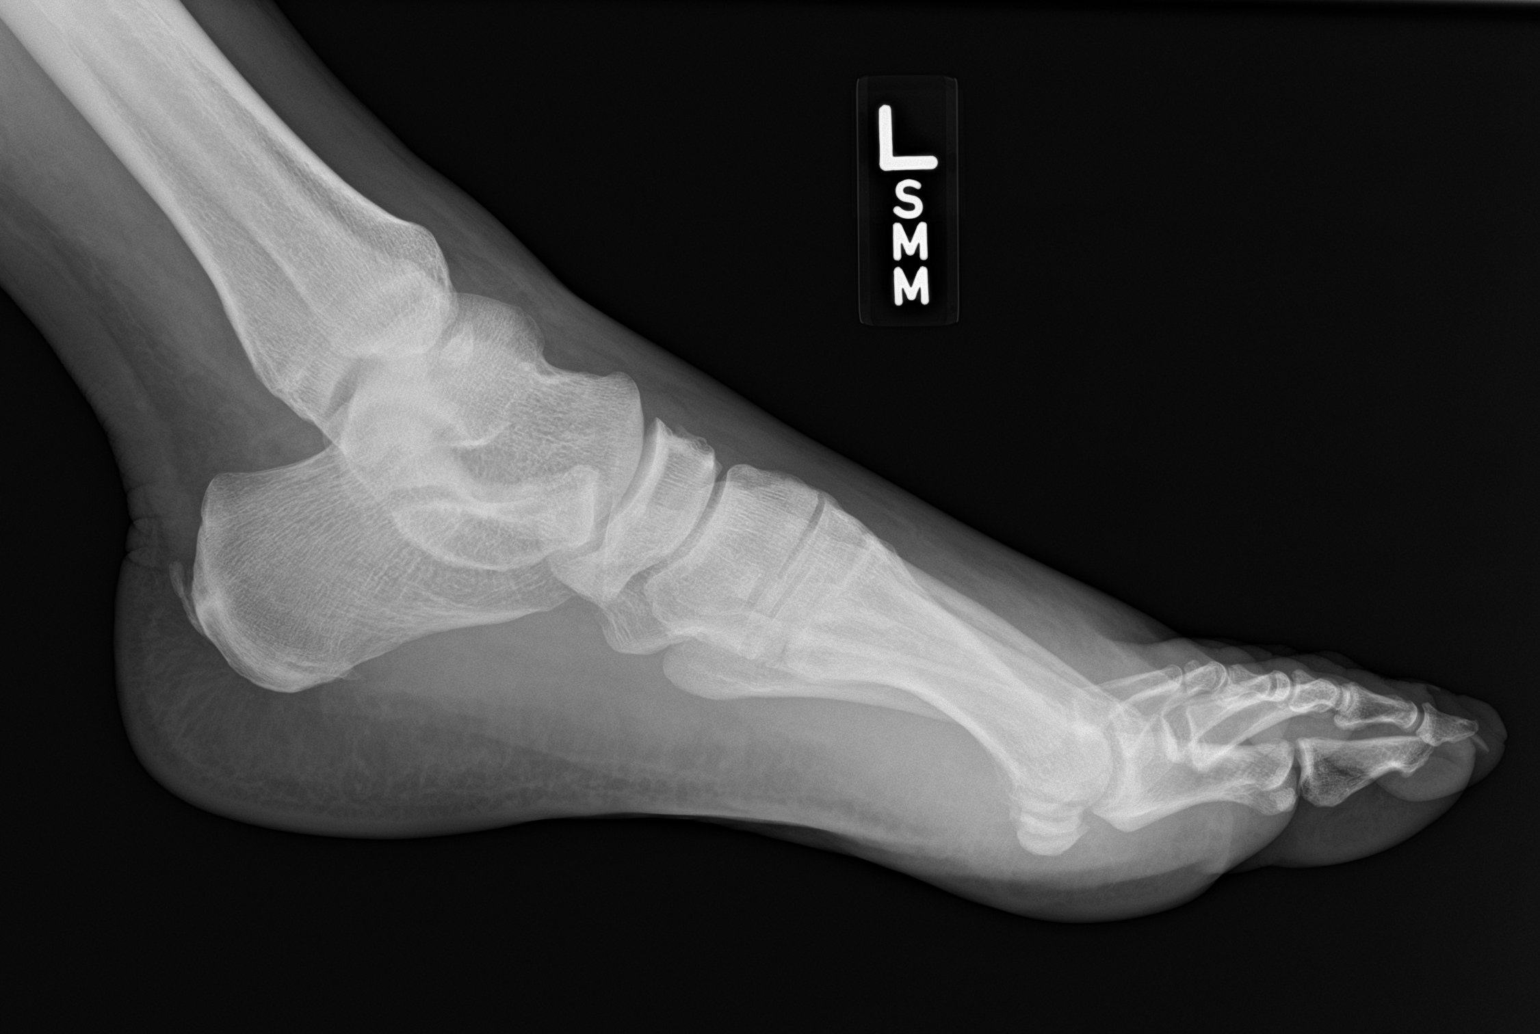

[3 of 3 positions shown; findings below may reference images not displayed]

FINDINGS: There is no evidence of fracture or dislocation. There is small
plantar calcaneal spur. Soft tissues are unremarkable.
IMPRESSION: No acute fracture or dislocation.

## 2020-09-06 ENCOUNTER — Other Ambulatory Visit: Payer: Self-pay

## 2020-09-06 ENCOUNTER — Ambulatory Visit
Admission: EM | Admit: 2020-09-06 | Discharge: 2020-09-06 | Disposition: A | Discharge status: Home / Self Care | Payer: BLUE CROSS/BLUE SHIELD | Attending: Emergency Medicine | Admitting: Emergency Medicine

## 2020-09-06 DIAGNOSIS — I160 Hypertensive urgency: Secondary | ICD-10-CM

## 2020-09-06 DIAGNOSIS — M109 Gout, unspecified: Secondary | ICD-10-CM

## 2020-09-06 DIAGNOSIS — Z9114 Patient's other noncompliance with medication regimen: Secondary | ICD-10-CM

## 2020-09-06 MED ORDER — COLCHICINE 0.6 MG PO TABS: 0.6000 mg | ORAL_TABLET | Freq: Every day | ORAL | 0 refills | Status: DC

## 2020-09-06 MED ORDER — AMLODIPINE BESYLATE 5 MG PO TABS: 5.0000 mg | ORAL_TABLET | Freq: Every day | ORAL | 0 refills | Status: DC

## 2020-09-06 MED ORDER — ALLOPURINOL 100 MG PO TABS: 100.0000 mg | ORAL_TABLET | Freq: Every day | ORAL | 0 refills | Status: DC

## 2020-09-06 NOTE — ED Triage Notes (Signed)
PT states he has had a gout flare up x 1 week and is having usual recurrent symptoms. PT is aox4 and ambulatory.

## 2020-09-06 NOTE — ED Provider Notes (Signed)
[name]    CSN: [id number] Arrival date & time: 09/06/20  1810      History   Chief Complaint Chief Complaint  Patient presents with  . Gout    flare up x 1 week    HPI Luis Harrington is a 54 y.o. male  With history of hypertension and gout presenting for gout flare to left great toe.  Patient verbalized history: States has been growing for the last week: Refractory to OTC NSAIDs.  Denies trauma, injury, numbness.  Patient's blood pressure significantly elevated: Has amlodipine at home, though takes it "here and there ".  Patient has been counseled per chart review previously about medication compliance and risk of untreated hypertension.  Past Medical History:  Diagnosis Date  . Hypertension     Patient Active Problem List   Diagnosis Date Noted  . Hyperglycemia 04/07/2018  . Dyslipidemia 04/07/2018  . Gout 04/06/2018  . Essential hypertension 04/06/2018    Past Surgical History:  Procedure Laterality Date  . TENDON REPAIR     Finger       Home Medications    Prior to Admission medications   Medication Sig Start Date End Date Taking? Authorizing Provider  allopurinol (ZYLOPRIM) 100 MG tablet Take 1 tablet (100 mg total) by mouth daily. 09/06/20   Hall-Potvin, [place], PA-C  amLODipine (NORVASC) 5 MG tablet Take 1 tablet (5 mg total) by mouth daily. 09/06/20   Hall-Potvin, [place], PA-C  Ascorbic Acid (VITAMIN C PO) Take 1 tablet by mouth daily.    [provider]  atorvastatin (LIPITOR) 40 MG tablet Take 1 tablet (40 mg total) by mouth daily. 04/08/18   Ardith Dark, MD  Cholecalciferol (VITAMIN D PO) Take 1 tablet by mouth daily.    [provider]  colchicine 0.6 MG tablet Take 1 tablet (0.6 mg total) by mouth daily. 09/06/20   Hall-Potvin, [place], PA-C  GINSENG PO Take 1 tablet by mouth daily.    [provider]  Multiple Vitamins-Minerals (ZINC PO) Take 1 tablet by mouth daily.    [provider]    tetrahydrozoline (VISINE) 0.05 % ophthalmic solution Place 1 drop into both eyes daily.    [provider]    Family History Family History  Problem Relation Age of Onset  . Diabetes Mother   . Hypertension Mother   . Diabetes Father   . Hypertension Father     Social History Social History   Tobacco Use  . Smoking status: Current Every Day Smoker  . Smokeless tobacco: Never Used  Vaping Use  . Vaping Use: Never used  Substance Use Topics  . Alcohol use: Yes  . Drug use: No     Allergies   Shrimp [shellfish allergy]   Review of Systems Review of Systems  Constitutional: Negative for fatigue and fever.  Respiratory: Negative for cough and shortness of breath.   Cardiovascular: Negative for chest pain and palpitations.  Gastrointestinal: Negative for abdominal pain, diarrhea and vomiting.  Musculoskeletal: Positive for joint swelling. Negative for arthralgias and myalgias.  Skin: Negative for rash and wound.  Neurological: Negative for speech difficulty and headaches.  All other systems reviewed and are negative.    Physical Exam Triage Vital Signs ED Triage Vitals  Enc Vitals Group     BP 09/06/20 1844 (!) 213/136     Pulse Rate 09/06/20 1844 99     Resp 09/06/20 1844 20     Temp 09/06/20 1844 99.4 F (37.4 C)  Temp Source 09/06/20 1844 Oral     SpO2 09/06/20 1844 98 %     Weight --      Height --      Head Circumference --      Peak Flow --      Pain Score 09/06/20 1856 6     Pain Loc --      Pain Edu? --      Excl. in GC? --    No data found.  Updated Vital Signs BP (!) 213/136 (BP Location: Left Arm)   Pulse 99   Temp 99.4 F (37.4 C) (Oral)   Resp 20   SpO2 98%   Visual Acuity Right Eye Distance:   Left Eye Distance:   Bilateral Distance:    Right Eye Near:   Left Eye Near:    Bilateral Near:     Physical Exam Constitutional:      General: He is not in acute distress. HENT:     Head: Normocephalic and atraumatic.   Eyes:     General: No scleral icterus.    Pupils: Pupils are equal, round, and reactive to light.  Cardiovascular:     Rate and Rhythm: Normal rate.  Pulmonary:     Effort: Pulmonary effort is normal. No respiratory distress.     Breath sounds: No wheezing.  Musculoskeletal:        General: Swelling and tenderness present.     Comments: Left great toe with MTP tenderness, swelling, warmth and erythema.  Neurovascularly intact.  Skin:    Coloration: Skin is not jaundiced or pale.  Neurological:     Mental Status: He is alert and oriented to person, place, and time.      UC Treatments / Results  Labs (all labs ordered are listed, but only abnormal results are displayed) Labs Reviewed - No data to display  EKG   Radiology No results found.  Procedures Procedures (including critical care time)  Medications Ordered in UC Medications - No data to display  Initial Impression / Assessment and Plan / UC Course  I have reviewed the triage vital signs and the nursing notes.  Pertinent labs & imaging results that were available during my care of the patient were reviewed by me and considered in my medical decision making (see chart for details).     H&P consistent with gout flare.  Patient able to check blood pressure at home: States it has only been elevated recently.  He has been counseled per chart review previously about medication compliance and risk of untreated hypertension.  Done so again today in office.  Will defer NSAID, steroid shot given elevated blood pressure in view of allopurinol, amlodipine, colchicine refill.  Has tolerated colchicine well in past.  Provided contact information for PCP follow-up.  Return precautions discussed, pt verbalized understanding and is agreeable to plan.  Final Clinical Impressions(s) / UC Diagnoses   Final diagnoses:  Acute gout of left foot, unspecified cause  Hypertensive urgency  Noncompliance with medications   Discharge  Instructions   None    ED Prescriptions    Medication Sig Dispense Auth. Provider   amLODipine (NORVASC) 5 MG tablet  (Status: Discontinued) Take 1 tablet (5 mg total) by mouth daily. 90 tablet Hall-Potvin, [place], PA-C   allopurinol (ZYLOPRIM) 100 MG tablet Take 1 tablet (100 mg total) by mouth daily. 30 tablet Hall-Potvin, [place], PA-C   colchicine 0.6 MG tablet Take 1 tablet (0.6 mg total) by mouth daily. 10 tablet  Hall-Potvin, [place], PA-C   amLODipine (NORVASC) 5 MG tablet Take 1 tablet (5 mg total) by mouth daily. 90 tablet Hall-Potvin, [place], PA-C     PDMP not reviewed this encounter.   Odette Fraction Duncan, [place] 09/06/20 1916

## 2020-11-03 ENCOUNTER — Other Ambulatory Visit: Payer: Self-pay

## 2020-11-03 ENCOUNTER — Ambulatory Visit
Admission: EM | Admit: 2020-11-03 | Discharge: 2020-11-03 | Disposition: A | Discharge status: Home / Self Care | Payer: 59 | Attending: Emergency Medicine | Admitting: Emergency Medicine

## 2020-11-03 DIAGNOSIS — I10 Essential (primary) hypertension: Secondary | ICD-10-CM

## 2020-11-03 DIAGNOSIS — M109 Gout, unspecified: Secondary | ICD-10-CM | POA: Diagnosis not present

## 2020-11-03 HISTORY — DX: Gout, unspecified: M10.9

## 2020-11-03 MED ORDER — COLCHICINE 0.6 MG PO TABS: ORAL_TABLET | ORAL | 0 refills | Status: DC

## 2020-11-03 MED ORDER — ALLOPURINOL 100 MG PO TABS: 100.0000 mg | ORAL_TABLET | Freq: Every day | ORAL | 0 refills | Status: DC

## 2020-11-03 MED ORDER — AMLODIPINE BESYLATE 5 MG PO TABS: 5.0000 mg | ORAL_TABLET | Freq: Every day | ORAL | 0 refills | Status: DC

## 2020-11-03 NOTE — Discharge Instructions (Signed)
Begin colchicine as prescribed; elevate foot Follow up if gout not resolving  Your blood pressure was elevated today in clinic. Please be sure to take blood pressure medications as prescribed. Please monitor your blood pressure at home or when you go to a [facility]. Please follow up with your primary care doctor to recheck blood pressure and discuss any need for medication changes.   Please go to Emergency Room if you start to experience severe headache, vision changes, decreased urine production, chest pain, shortness of breath, speech slurring, one sided weakness.

## 2020-11-03 NOTE — ED Triage Notes (Signed)
Patient presents to Urgent Care with complaints of HTN, gout flare-up since yesterday pain increased with movement of left foot. Pt states he has a hx of HTN and reports not taking any of his current medications due to lack of insurance. Pt states his baseline BP with meds is in 160's-180's.

## 2020-11-03 NOTE — ED Provider Notes (Signed)
[name]    CSN: [id number] Arrival date & time: 11/03/20  1031      History   Chief Complaint Chief Complaint  Patient presents with  . Hypertension    HPI Luis Harrington is a 55 y.o. male history of hypertension presenting today for evaluation of hypertension and gout.  Reports yesterday began to develop pain and swelling at the base of his left great toe, has also had some slight discomfort in his left knee as well.  Denies any injury or fall.  Feels similar to prior gout flares.  Previously had improvement with colchicine.  In the past has been on allopurinol, but has not been taking this of recently.  Last flare approximately 1 to 2 months ago.  Also reports that he has been off of his blood pressure medicine for approximately 6 months.  Plans on getting set up with primary care.  Previously on amlodipine without problem.  Denies any chest pain, shortness of breath, nausea, vision changes or headaches.  HPI  Past Medical History:  Diagnosis Date  . Gout   . Hypertension     Patient Active Problem List   Diagnosis Date Noted  . Hyperglycemia 04/07/2018  . Dyslipidemia 04/07/2018  . Gout 04/06/2018  . Essential hypertension 04/06/2018    Past Surgical History:  Procedure Laterality Date  . TENDON REPAIR     Finger       Home Medications    Prior to Admission medications   Medication Sig Start Date End Date Taking? Authorizing Provider  colchicine 0.6 MG tablet Take 2 tablets at first sign of flare, 1 tablet 1 hour later on day 1; then 1-2 tablets daily until flare resolves 11/03/20  Yes Durene Dodge C, PA-C  allopurinol (ZYLOPRIM) 100 MG tablet Take 1 tablet (100 mg total) by mouth daily. Start after flare resolves 11/03/20   Azarria Balint C, PA-C  amLODipine (NORVASC) 5 MG tablet Take 1 tablet (5 mg total) by mouth daily. 11/03/20   Bettie Swavely C, PA-C  Ascorbic Acid (VITAMIN C PO) Take 1 tablet by mouth daily.    [provider]   atorvastatin (LIPITOR) 40 MG tablet Take 1 tablet (40 mg total) by mouth daily. 04/08/18   Ardith Dark, MD  Cholecalciferol (VITAMIN D PO) Take 1 tablet by mouth daily.    [provider]  GINSENG PO Take 1 tablet by mouth daily.    [provider]  Multiple Vitamins-Minerals (ZINC PO) Take 1 tablet by mouth daily.    [provider]  tetrahydrozoline (VISINE) 0.05 % ophthalmic solution Place 1 drop into both eyes daily.    [provider]    Family History Family History  Problem Relation Age of Onset  . Diabetes Mother   . Hypertension Mother   . Diabetes Father   . Hypertension Father     Social History Social History   Tobacco Use  . Smoking status: Current Some Day Smoker  . Smokeless tobacco: Never Used  Vaping Use  . Vaping Use: Never used  Substance Use Topics  . Alcohol use: Yes  . Drug use: No     Allergies   Shrimp [shellfish allergy]   Review of Systems Review of Systems  Constitutional: Negative for fatigue and fever.  Eyes: Negative for redness, itching and visual disturbance.  Respiratory: Negative for shortness of breath.   Cardiovascular: Negative for chest pain and leg swelling.  Gastrointestinal: Negative for nausea and vomiting.  Musculoskeletal: Positive  for arthralgias, gait problem and joint swelling. Negative for myalgias.  Skin: Negative for color change, rash and wound.  Neurological: Negative for dizziness, syncope, weakness, light-headedness and headaches.     Physical Exam Triage Vital Signs ED Triage Vitals  Enc Vitals Group     BP      Pulse      Resp      Temp      Temp src      SpO2      Weight      Height      Head Circumference      Peak Flow      Pain Score      Pain Loc      Pain Edu?      Excl. in GC?    No data found.  Updated Vital Signs BP (S) (!) 223/157 (BP Location: Right Arm) Comment: Baseline in the 160's-180's  Pulse 99   Temp 99 F (37.2 C) (Temporal)    Resp 16   Wt 240 lb (108.9 kg)   SpO2 98%   BMI 33.47 kg/m   Visual Acuity Right Eye Distance:   Left Eye Distance:   Bilateral Distance:    Right Eye Near:   Left Eye Near:    Bilateral Near:     Physical Exam Vitals and nursing note reviewed.  Constitutional:      Appearance: He is well-developed and well-nourished.     Comments: No acute distress  HENT:     Head: Normocephalic and atraumatic.     Nose: Nose normal.  Eyes:     Conjunctiva/sclera: Conjunctivae normal.  Cardiovascular:     Rate and Rhythm: Normal rate and regular rhythm.  Pulmonary:     Effort: Pulmonary effort is normal. No respiratory distress.  Abdominal:     General: There is no distension.  Musculoskeletal:        General: Normal range of motion.     Cervical back: Neck supple.     Comments: Base of left great toe with warmth and swelling, dorsalis pedis 2+  Ambulating with antalgia  Skin:    General: Skin is warm and dry.  Neurological:     Mental Status: He is alert and oriented to person, place, and time.  Psychiatric:        Mood and Affect: Mood and affect normal.      UC Treatments / Results  Labs (all labs ordered are listed, but only abnormal results are displayed) Labs Reviewed - No data to display  EKG   Radiology No results found.  Procedures Procedures (including critical care time)  Medications Ordered in UC Medications - No data to display  Initial Impression / Assessment and Plan / UC Course  I have reviewed the triage vital signs and the nursing notes.  Pertinent labs & imaging results that were available during my care of the patient were reviewed by me and considered in my medical decision making (see chart for details).     1.  Gout-refilled colchicine as this is helped in the past, allopurinol to restart after resolution of flare.  2.  Uncontrolled hypertension-refilled amlodipine, stressed importance of monitoring and follow-up with PCP as likely  amlodipine alone will not fully resolve elevated blood pressure.  Discussed warning signs.  Discussed strict return precautions. Patient verbalized understanding and is agreeable with plan.  Final Clinical Impressions(s) / UC Diagnoses   Final diagnoses:  Acute gout involving toe of left foot, unspecified  cause  Essential hypertension     Discharge Instructions     Begin colchicine as prescribed; elevate foot Follow up if gout not resolving  Your blood pressure was elevated today in clinic. Please be sure to take blood pressure medications as prescribed. Please monitor your blood pressure at home or when you go to a [facility]. Please follow up with your primary care doctor to recheck blood pressure and discuss any need for medication changes.   Please go to Emergency Room if you start to experience severe headache, vision changes, decreased urine production, chest pain, shortness of breath, speech slurring, one sided weakness.    ED Prescriptions    Medication Sig Dispense Auth. Provider   amLODipine (NORVASC) 5 MG tablet Take 1 tablet (5 mg total) by mouth daily. 90 tablet Bryn Perkin C, PA-C   colchicine 0.6 MG tablet Take 2 tablets at first sign of flare, 1 tablet 1 hour later on day 1; then 1-2 tablets daily until flare resolves 12 tablet Alexine Pilant C, PA-C   allopurinol (ZYLOPRIM) 100 MG tablet Take 1 tablet (100 mg total) by mouth daily. Start after flare resolves 90 tablet Lyric Rossano, [place] C, PA-C     PDMP not reviewed this encounter.   Lew Dawes, [place] 11/03/20 1228

## 2020-11-05 ENCOUNTER — Encounter: Payer: Self-pay | Admitting: Emergency Medicine

## 2020-11-05 ENCOUNTER — Ambulatory Visit
Admission: EM | Admit: 2020-11-05 | Discharge: 2020-11-05 | Disposition: A | Discharge status: Home / Self Care | Payer: 59 | Attending: Internal Medicine | Admitting: Internal Medicine

## 2020-11-05 ENCOUNTER — Telehealth: Payer: Self-pay | Admitting: Internal Medicine

## 2020-11-05 ENCOUNTER — Other Ambulatory Visit: Payer: Self-pay

## 2020-11-05 DIAGNOSIS — M1009 Idiopathic gout, multiple sites: Secondary | ICD-10-CM

## 2020-11-05 MED ORDER — TRAMADOL HCL 50 MG PO TABS: 50.0000 mg | ORAL_TABLET | Freq: Four times a day (QID) | ORAL | 0 refills | Status: DC | PRN

## 2020-11-05 MED ORDER — AMLODIPINE BESYLATE 5 MG PO TABS
10.0000 mg | ORAL_TABLET | Freq: Every day | ORAL | 0 refills | Status: DC
Start: 2020-11-05 — End: 2020-12-07

## 2020-11-05 MED ORDER — TRAMADOL HCL 50 MG PO TABS
50.0000 mg | ORAL_TABLET | Freq: Four times a day (QID) | ORAL | 0 refills | Status: DC | PRN
Start: 2020-11-05 — End: 2020-11-05

## 2020-11-05 MED ORDER — PREDNISONE 20 MG PO TABS: 40.0000 mg | ORAL_TABLET | Freq: Every day | ORAL | 0 refills | Status: AC

## 2020-11-05 NOTE — Discharge Instructions (Signed)
Please stop taking allopurinol and colchicine Start prednisone Gentle range of motion exercises Icing of the left knee.

## 2020-11-05 NOTE — ED Provider Notes (Addendum)
[name]    CSN: [id number] Arrival date & time: 11/05/20  1057      History   Chief Complaint Chief Complaint  Patient presents with  . Knee Pain  . Hypertension    HPI Luis Harrington is a 55 y.o. male with a history of gout comes to the urgent care for worsening pain in the left great toe and left knee swelling.  Patient was seen for acute gout flare few days ago and was prescribed colchicine and allopurinol.  Patient started taking both medications, he developed diarrhea and had worsening pain in the left great toe.  He also noticed painful swelling to the left knee.  He stopped taking the medications yesterday and comes in to be seen again today.  No fever or chills.  No nausea or vomiting.   No fall or trauma  Patient has a history of hypertension, previously noncompliant with his medications and recently started on amlodipine 5 mg daily.  Patient comes in with elevated blood pressure of 189/134.  He denies any headaches, chest pain or abdominal pain.  HPI  Past Medical History:  Diagnosis Date  . Gout   . Hypertension     Patient Active Problem List   Diagnosis Date Noted  . Hyperglycemia 04/07/2018  . Dyslipidemia 04/07/2018  . Gout 04/06/2018  . Essential hypertension 04/06/2018    Past Surgical History:  Procedure Laterality Date  . TENDON REPAIR     Finger       Home Medications    Prior to Admission medications   Medication Sig Start Date End Date Taking? Authorizing Provider  predniSONE (DELTASONE) 20 MG tablet Take 2 tablets (40 mg total) by mouth daily for 7 days. 11/05/20 11/12/20 Yes Kirbi Farrugia, Britta Mccreedy, MD  traMADol (ULTRAM) 50 MG tablet Take 1 tablet (50 mg total) by mouth every 6 (six) hours as needed. 11/05/20  Yes Jodiann Ognibene, Britta Mccreedy, MD  amLODipine (NORVASC) 5 MG tablet Take 1 tablet (5 mg total) by mouth daily. 11/03/20   Wieters, Hallie C, PA-C  Ascorbic Acid (VITAMIN C PO) Take 1 tablet by mouth daily.    [provider]   atorvastatin (LIPITOR) 40 MG tablet Take 1 tablet (40 mg total) by mouth daily. 04/08/18   Ardith Dark, MD  Cholecalciferol (VITAMIN D PO) Take 1 tablet by mouth daily.    [provider]  GINSENG PO Take 1 tablet by mouth daily.    [provider]  Multiple Vitamins-Minerals (ZINC PO) Take 1 tablet by mouth daily.    [provider]  tetrahydrozoline (VISINE) 0.05 % ophthalmic solution Place 1 drop into both eyes daily.    [provider]  allopurinol (ZYLOPRIM) 100 MG tablet Take 1 tablet (100 mg total) by mouth daily. Start after flare resolves 11/03/20 11/05/20  Wieters, Hallie C, PA-C  colchicine 0.6 MG tablet Take 2 tablets at first sign of flare, 1 tablet 1 hour later on day 1; then 1-2 tablets daily until flare resolves 11/03/20 11/05/20  Wieters, Junius Creamer, PA-C    Family History Family History  Problem Relation Age of Onset  . Diabetes Mother   . Hypertension Mother   . Diabetes Father   . Hypertension Father     Social History Social History   Tobacco Use  . Smoking status: Current Some Day Smoker  . Smokeless tobacco: Never Used  Vaping Use  . Vaping Use: Never used  Substance Use Topics  . Alcohol use: Yes  .  Drug use: No     Allergies   Shrimp [shellfish allergy]   Review of Systems Review of Systems  Constitutional: Negative for chills, fatigue and fever.  Musculoskeletal: Positive for arthralgias and joint swelling. Negative for back pain and myalgias.  Skin: Negative.  Negative for color change, rash and wound.     Physical Exam Triage Vital Signs ED Triage Vitals  Enc Vitals Group     BP 11/05/20 1132 (!) 183/142     Pulse Rate 11/05/20 1132 (!) 115     Resp 11/05/20 1132 18     Temp 11/05/20 1132 98.5 F (36.9 C)     Temp Source 11/05/20 1132 Oral     SpO2 11/05/20 1132 95 %     Weight --      Height --      Head Circumference --      Peak Flow --      Pain Score 11/05/20 1133 10     Pain Loc --       Pain Edu? --      Excl. in GC? --    No data found.  Updated Vital Signs BP (!) 183/142 (BP Location: Left Arm)   Pulse (!) 115   Temp 98.5 F (36.9 C) (Oral)   Resp 18   SpO2 95%   Visual Acuity Right Eye Distance:   Left Eye Distance:   Bilateral Distance:    Right Eye Near:   Left Eye Near:    Bilateral Near:     Physical Exam Vitals and nursing note reviewed.  Constitutional:      General: He is in acute distress.     Appearance: He is not ill-appearing.  Cardiovascular:     Rate and Rhythm: Normal rate and regular rhythm.  Pulmonary:     Effort: Pulmonary effort is normal.     Breath sounds: Normal breath sounds.  Musculoskeletal:        General: Swelling and tenderness present. No deformity or signs of injury. Normal range of motion.  Neurological:     Mental Status: He is alert.      UC Treatments / Results  Labs (all labs ordered are listed, but only abnormal results are displayed) Labs Reviewed - No data to display  EKG   Radiology No results found.  Procedures Procedures (including critical care time)  Medications Ordered in UC Medications - No data to display  Initial Impression / Assessment and Plan / UC Course  I have reviewed the triage vital signs and the nursing notes.  Pertinent labs & imaging results that were available during my care of the patient were reviewed by me and considered in my medical decision making (see chart for details).     1.  Acute gout flare involving multiple sites: Stop allopurinol and colchicine Start prednisone 40 mg orally daily for 7 days Tramadol as needed for pain Gentle range of motion exercises Okay to ice the left knee If symptoms worsen please return to the urgent care to be reevaluated.  2.  Hypertensive urgency likely secondary to pain Increase amlodipine to 10 mg orally daily If you develop severe headache, chest pain or abdominal pain please go to the emergency department for further  management. Final Clinical Impressions(s) / UC Diagnoses   Final diagnoses:  Acute idiopathic gout of multiple sites     Discharge Instructions     Please stop taking allopurinol and colchicine Start prednisone Gentle range of motion exercises Icing of  the left knee.   ED Prescriptions    Medication Sig Dispense Auth. Provider   predniSONE (DELTASONE) 20 MG tablet Take 2 tablets (40 mg total) by mouth daily for 7 days. 14 tablet Zaya Kessenich, Britta Mccreedy, MD   traMADol (ULTRAM) 50 MG tablet Take 1 tablet (50 mg total) by mouth every 6 (six) hours as needed. 15 tablet Keandra Medero, Britta Mccreedy, MD     I have reviewed the PDMP during this encounter.   Merrilee Jansky, MD 11/05/20 1223    Merrilee Jansky, MD 11/05/20 1226

## 2020-11-05 NOTE — ED Triage Notes (Signed)
Pt here for increasing left knee and ankle pain after being given gout meds on Saturday with effect; pt sts is taking htn meds since Saturday but still noted hypertensive; left knee swelling noted

## 2020-11-28 ENCOUNTER — Ambulatory Visit: Payer: 59 | Admitting: Physician Assistant

## 2020-11-28 ENCOUNTER — Ambulatory Visit: Payer: 59

## 2020-12-07 ENCOUNTER — Encounter: Payer: Self-pay | Admitting: Emergency Medicine

## 2020-12-07 ENCOUNTER — Other Ambulatory Visit: Payer: Self-pay

## 2020-12-07 ENCOUNTER — Ambulatory Visit
Admission: EM | Admit: 2020-12-07 | Discharge: 2020-12-07 | Disposition: A | Discharge status: Home / Self Care | Payer: 59 | Attending: Physician Assistant | Admitting: Physician Assistant

## 2020-12-07 DIAGNOSIS — I10 Essential (primary) hypertension: Secondary | ICD-10-CM | POA: Diagnosis not present

## 2020-12-07 MED ORDER — AMLODIPINE BESYLATE 5 MG PO TABS: 10.0000 mg | ORAL_TABLET | Freq: Every day | ORAL | 1 refills | Status: DC

## 2020-12-07 NOTE — ED Provider Notes (Signed)
[name]    CSN: [id number] Arrival date & time: 12/07/20  2229      History   Chief Complaint Chief Complaint  Patient presents with  . Hypertension    HPI Luis Harrington is a 55 y.o. male.   Pt presents for uncontrolled HTN.  He has been seen here at [facility] before for this problem.  5mg  amlodipine prescribed initially.  At last visit Amlodipine increased to 10mg .  He reports he initially had improvement of BP with increasing to 10mg , but then ran out of medication.  He was out of medicine for about 1 week.  When he refilled it he went back to taking only 5mg  daily.  He has been taking this dose for about 1 week.  Denies headache, visual disturbances, chest pain, lower extremity swelling.  He has not yet established care with PCP.      Past Medical History:  Diagnosis Date  . Gout   . Hypertension     Patient Active Problem List   Diagnosis Date Noted  . Hyperglycemia 04/07/2018  . Dyslipidemia 04/07/2018  . Gout 04/06/2018  . Essential hypertension 04/06/2018    Past Surgical History:  Procedure Laterality Date  . TENDON REPAIR     Finger       Home Medications    Prior to Admission medications   Medication Sig Start Date End Date Taking? Authorizing Provider  amLODipine (NORVASC) 5 MG tablet Take 2 tablets (10 mg total) by mouth daily. 12/07/20   04/09/2018, PA-C  Ascorbic Acid (VITAMIN C PO) Take 1 tablet by mouth daily.    [provider]  atorvastatin (LIPITOR) 40 MG tablet Take 1 tablet (40 mg total) by mouth daily. 04/08/18   06/06/2018, MD  Cholecalciferol (VITAMIN D PO) Take 1 tablet by mouth daily.    [provider]  GINSENG PO Take 1 tablet by mouth daily.    [provider]  Multiple Vitamins-Minerals (ZINC PO) Take 1 tablet by mouth daily.    [provider]  tetrahydrozoline (VISINE) 0.05 % ophthalmic solution Place 1 drop into both eyes daily.    [provider]  traMADol  (ULTRAM) 50 MG tablet Take 1 tablet (50 mg total) by mouth every 6 (six) hours as needed. 11/05/20   LampteyJodell Cipro, MD  allopurinol (ZYLOPRIM) 100 MG tablet Take 1 tablet (100 mg total) by mouth daily. Start after flare resolves 11/03/20 11/05/20  Wieters, Hallie C, PA-C  colchicine 0.6 MG tablet Take 2 tablets at first sign of flare, 1 tablet 1 hour later on day 1; then 1-2 tablets daily until flare resolves 11/03/20 11/05/20  Wieters, 01/01/21, PA-C    Family History Family History  Problem Relation Age of Onset  . Diabetes Mother   . Hypertension Mother   . Diabetes Father   . Hypertension Father     Social History Social History   Tobacco Use  . Smoking status: Current Some Day Smoker  . Smokeless tobacco: Never Used  Vaping Use  . Vaping Use: Never used  Substance Use Topics  . Alcohol use: Yes  . Drug use: No     Allergies   Shrimp [shellfish allergy]   Review of Systems Review of Systems  Constitutional: Negative for chills and fever.  HENT: Negative for ear pain and sore throat.   Eyes: Negative for pain and visual disturbance.  Respiratory: Negative for cough and shortness of breath.   Cardiovascular: Negative for chest  pain and palpitations.  Gastrointestinal: Negative for abdominal pain and vomiting.  Genitourinary: Negative for dysuria and hematuria.  Musculoskeletal: Negative for arthralgias and back pain.  Skin: Negative for color change and rash.  Neurological: Negative for seizures and syncope.  All other systems reviewed and are negative.    Physical Exam Triage Vital Signs ED Triage Vitals [12/07/20 0840]  Enc Vitals Group     BP (!) 220/127     Pulse Rate 85     Resp 18     Temp 98.3 F (36.8 C)     Temp Source Oral     SpO2 98 %     Weight      Height      Head Circumference      Peak Flow      Pain Score 0     Pain Loc      Pain Edu?      Excl. in GC?    No data found.  Updated Vital Signs BP (!) 197/130 (BP Location: Right Arm)    Pulse 85   Temp 98.3 F (36.8 C) (Oral)   Resp 18   SpO2 98%   Visual Acuity Right Eye Distance:   Left Eye Distance:   Bilateral Distance:    Right Eye Near:   Left Eye Near:    Bilateral Near:     Physical Exam Vitals and nursing note reviewed.  Constitutional:      Appearance: He is well-developed and well-nourished.  HENT:     Head: Normocephalic and atraumatic.  Eyes:     Conjunctiva/sclera: Conjunctivae normal.  Cardiovascular:     Rate and Rhythm: Normal rate and regular rhythm.     Heart sounds: No murmur heard.   Pulmonary:     Effort: Pulmonary effort is normal. No respiratory distress.     Breath sounds: Normal breath sounds.  Abdominal:     Palpations: Abdomen is soft.     Tenderness: There is no abdominal tenderness.  Musculoskeletal:        General: No edema.     Cervical back: Neck supple.  Skin:    General: Skin is warm and dry.  Neurological:     Mental Status: He is alert.  Psychiatric:        Mood and Affect: Mood and affect normal.      UC Treatments / Results  Labs (all labs ordered are listed, but only abnormal results are displayed) Labs Reviewed - No data to display  EKG   Radiology No results found.  Procedures Procedures (including critical care time)  Medications Ordered in UC Medications - No data to display  Initial Impression / Assessment and Plan / UC Course  I have reviewed the triage vital signs and the nursing notes.  Pertinent labs & imaging results that were available during my care of the patient were reviewed by me and considered in my medical decision making (see chart for details).    Refill sent of Amlodipine, he will take 2 5mg  tablets daily.  I am not sure he ever got the prescription that was sent in last time.  Discussed the importance of establishing care with PCP.  Pt reports he has difficulty getting off work for these visits, but will call Monday.  Strict return precautions given.  Final  Clinical Impressions(s) / UC Diagnoses   Final diagnoses:  Essential hypertension     Discharge Instructions     Take 2 mg tablets of Amlodipine daily.  Follow up with PCP    ED Prescriptions    Medication Sig Dispense Auth. Provider   amLODipine (NORVASC) 5 MG tablet Take 2 tablets (10 mg total) by mouth daily. 90 tablet Jodell Cipro, PA-C     PDMP not reviewed this encounter.   Jodell Cipro, PA-C 12/07/20 1023

## 2020-12-07 NOTE — Discharge Instructions (Addendum)
Take 2 mg tablets of Amlodipine daily.  Follow up with PCP

## 2020-12-07 NOTE — ED Triage Notes (Signed)
Pt here for hypertension; pt seen here for same in past and given meds; pt sts unable to see a PCP and still taking meds but noticing elevated BP

## 2020-12-23 ENCOUNTER — Telehealth: Payer: Self-pay

## 2020-12-23 NOTE — Telephone Encounter (Signed)
Spoke with patient by phone as a friendly reminder for an upcoming locations for the [facility] appt. Patient acknowledged their upcoming appt and time.

## 2020-12-25 ENCOUNTER — Other Ambulatory Visit: Payer: Self-pay

## 2020-12-25 ENCOUNTER — Ambulatory Visit: Payer: 59 | Admitting: Physician Assistant

## 2020-12-25 VITALS — BP 194/132 | HR 87 | Temp 98.2°F | Resp 18 | Ht 71.5 in | Wt 246.0 lb

## 2020-12-25 DIAGNOSIS — Z125 Encounter for screening for malignant neoplasm of prostate: Secondary | ICD-10-CM

## 2020-12-25 DIAGNOSIS — Z8739 Personal history of other diseases of the musculoskeletal system and connective tissue: Secondary | ICD-10-CM

## 2020-12-25 DIAGNOSIS — Z2821 Immunization not carried out because of patient refusal: Secondary | ICD-10-CM

## 2020-12-25 DIAGNOSIS — Z114 Encounter for screening for human immunodeficiency virus [HIV]: Secondary | ICD-10-CM

## 2020-12-25 DIAGNOSIS — I10 Essential (primary) hypertension: Secondary | ICD-10-CM | POA: Diagnosis not present

## 2020-12-25 DIAGNOSIS — Z1159 Encounter for screening for other viral diseases: Secondary | ICD-10-CM

## 2020-12-25 DIAGNOSIS — I160 Hypertensive urgency: Secondary | ICD-10-CM | POA: Diagnosis not present

## 2020-12-25 DIAGNOSIS — E785 Hyperlipidemia, unspecified: Secondary | ICD-10-CM

## 2020-12-25 MED ORDER — LISINOPRIL 10 MG PO TABS: 10.0000 mg | ORAL_TABLET | Freq: Every day | ORAL | 0 refills | Status: DC

## 2020-12-25 MED ORDER — CLONIDINE HCL 0.2 MG PO TABS
0.2000 mg | ORAL_TABLET | Freq: Once | ORAL | Status: AC
  Administered 2020-12-25: 0.2 mg via ORAL

## 2020-12-25 MED ORDER — HYDROCHLOROTHIAZIDE 25 MG PO TABS: 25.0000 mg | ORAL_TABLET | Freq: Every day | ORAL | 0 refills | Status: DC

## 2020-12-25 NOTE — Progress Notes (Signed)
New Patient Office Visit  Subjective:  Patient ID: Luis Harrington, male    DOB: 1966-01-20  Age: 55 y.o. MRN: [id number]  CC:  Chief Complaint  Patient presents with  . Hypertension    HPI Luis Harrington reports that he was recently seen at urgent care after having several elevated blood pressure readings.  Reports that he has previously been treated for hypertension, but did stop his medications due to financial difficulties.  Reports that he was started on amlodipine 5 mg and then increased to 10 mg at his second urgent care visit.  Reports that his readings at home continue to be elevated, approximately 165/100.  Reports that he did take 10 mg of amlodipine this morning.  Reports that he does have a history of gout, states that he previously had taking allopurinol.  Reports that he has improved his lifestyle and is no longer drinking alcohol or eating red meat.  Last episode of gout was in January 2022.  Reports that he is also  drinking a lot of water every day  Reports that he was previously treated for high cholesterol, has not been taking this medication in several years due to financial difficulties    Past Medical History:  Diagnosis Date  . Gout   . Hypertension     Past Surgical History:  Procedure Laterality Date  . TENDON REPAIR     Finger    Family History  Problem Relation Age of Onset  . Diabetes Mother   . Hypertension Mother   . Diabetes Father   . Hypertension Father     Social History   Socioeconomic History  . Marital status: Married    Spouse name: Not on file  . Number of children: Not on file  . Years of education: Not on file  . Highest education level: Not on file  Occupational History  . Not on file  Tobacco Use  . Smoking status: Former Games developer  . Smokeless tobacco: Never Used  Vaping Use  . Vaping Use: Never used  Substance and Sexual Activity  . Alcohol use: Yes  . Drug use: No  . Sexual activity: Yes    Partners: Female   Other Topics Concern  . Not on file  Social History Narrative  . Not on file   Social Determinants of Health   Financial Resource Strain: Not on file  Food Insecurity: Not on file  Transportation Needs: Not on file  Physical Activity: Not on file  Stress: Not on file  Social Connections: Not on file  Intimate Partner Violence: Not on file    ROS Review of Systems  Constitutional: Negative.   HENT: Negative.   Eyes: Negative.   Respiratory: Negative for shortness of breath.   Cardiovascular: Negative for chest pain and palpitations.  Gastrointestinal: Negative.   Endocrine: Negative.   Genitourinary: Negative.   Musculoskeletal: Negative.   Skin: Negative.   Allergic/Immunologic: Negative.   Neurological: Negative.   Hematological: Negative.   Psychiatric/Behavioral: Negative.     Objective:   Today's Vitals: BP (!) 194/132 (BP Location: Right Arm, Cuff Size: Large)   Pulse 87   Temp 98.2 F (36.8 C) (Oral)   Resp 18   Ht 5' 11.5" (1.816 m)   Wt 246 lb (111.6 kg)   SpO2 100%   BMI 33.83 kg/m   Physical Exam Vitals and nursing note reviewed.  Constitutional:      General: He is not in acute distress.    Appearance: Normal  appearance. He is not ill-appearing.  HENT:     Head: Normocephalic and atraumatic.     Right Ear: External ear normal.     Left Ear: External ear normal.     Nose: Nose normal.     Mouth/Throat:     Mouth: Mucous membranes are moist.     Pharynx: Oropharynx is clear.  Eyes:     Conjunctiva/sclera: Conjunctivae normal.     Pupils: Pupils are equal, round, and reactive to light.  Cardiovascular:     Rate and Rhythm: Normal rate and regular rhythm.     Pulses: Normal pulses.     Heart sounds: Normal heart sounds.  Pulmonary:     Effort: Pulmonary effort is normal.  Musculoskeletal:        General: Normal range of motion.     Cervical back: Normal range of motion and neck supple.  Skin:    General: Skin is warm and dry.   Neurological:     General: No focal deficit present.     Mental Status: He is alert and oriented to person, place, and time.  Psychiatric:        Mood and Affect: Mood normal.        Behavior: Behavior normal.        Thought Content: Thought content normal.        Judgment: Judgment normal.     Assessment & Plan:   Problem List Items Addressed This Visit      Cardiovascular and Mediastinum   Essential hypertension - Primary   Relevant Medications   lisinopril (ZESTRIL) 10 MG tablet   hydrochlorothiazide (HYDRODIURIL) 25 MG tablet   Other Relevant Orders   CBC with Differential/Platelet   Comp. Metabolic Panel (12)     Other   Dyslipidemia   Relevant Orders   Lipid panel   History of gout   Relevant Orders   Uric Acid    Other Visit Diagnoses    Hypertensive urgency       Relevant Medications   lisinopril (ZESTRIL) 10 MG tablet   hydrochlorothiazide (HYDRODIURIL) 25 MG tablet   cloNIDine (CATAPRES) tablet 0.2 mg (Completed)   Other Relevant Orders   TSH   Screening PSA (prostate specific antigen)       Relevant Orders   PSA   Screening for HIV without presence of risk factors       Relevant Orders   HIV antibody (with reflex)   Encounter for HCV screening test for low risk patient       Relevant Orders   HCV Ab w/Rflx to Verification   COVID-19 vaccination refused          Outpatient Encounter Medications as of 12/25/2020  Medication Sig  . hydrochlorothiazide (HYDRODIURIL) 25 MG tablet Take 1 tablet (25 mg total) by mouth daily.  Marland Kitchen lisinopril (ZESTRIL) 10 MG tablet Take 1 tablet (10 mg total) by mouth daily.  . [DISCONTINUED] allopurinol (ZYLOPRIM) 100 MG tablet Take 1 tablet (100 mg total) by mouth daily. Start after flare resolves  . [DISCONTINUED] amLODipine (NORVASC) 5 MG tablet Take 2 tablets (10 mg total) by mouth daily.  . [DISCONTINUED] Ascorbic Acid (VITAMIN C PO) Take 1 tablet by mouth daily.  . [DISCONTINUED] atorvastatin (LIPITOR) 40 MG tablet  Take 1 tablet (40 mg total) by mouth daily.  . [DISCONTINUED] Cholecalciferol (VITAMIN D PO) Take 1 tablet by mouth daily.  . [DISCONTINUED] colchicine 0.6 MG tablet Take 2 tablets at first sign of flare, 1  tablet 1 hour later on day 1; then 1-2 tablets daily until flare resolves  . [DISCONTINUED] GINSENG PO Take 1 tablet by mouth daily.  . [DISCONTINUED] Multiple Vitamins-Minerals (ZINC PO) Take 1 tablet by mouth daily.  . [DISCONTINUED] tetrahydrozoline (VISINE) 0.05 % ophthalmic solution Place 1 drop into both eyes daily.  . [DISCONTINUED] traMADol (ULTRAM) 50 MG tablet Take 1 tablet (50 mg total) by mouth every 6 (six) hours as needed.  . [EXPIRED] cloNIDine (CATAPRES) tablet 0.2 mg    No facility-administered encounter medications on file as of 12/25/2020.   1. Essential hypertension Patient education given on lifestyle modifications.  On review of patient's chart, he previously was taking lisinopril hydrochlorothiazide combination.  Patient does have history of gout, patient education given on importance of alcohol avoidance, continue maintaining proper hydration.  Patient encouraged to check blood pressure on a daily basis, keep a written log and bring to follow-up visit.  Patient will be given appointment to establish care at [facility], patient to return to the mobile unit on March 7 for fasting - CBC with Differential/Platelet; Future - Comp. Metabolic Panel (12); Future - lisinopril (ZESTRIL) 10 MG tablet; Take 1 tablet (10 mg total) by mouth daily.  Dispense: 90 tablet; Refill: 0 - hydrochlorothiazide (HYDRODIURIL) 25 MG tablet; Take 1 tablet (25 mg total) by mouth daily.  Dispense: 90 tablet; Refill: 0  2. Hypertensive urgency Red flags given for prompt evaluation at emergency department, no hypertensive symptoms present - TSH; Future - cloNIDine (CATAPRES) tablet 0.2 mg  3. Dyslipidemia  - Lipid panel; Future  4. History of gout  - Uric Acid; Future  5.  Screening PSA (prostate specific antigen)  - PSA; Future  6. Screening for HIV without presence of risk factors  - HIV antibody (with reflex); Future  7. Encounter for HCV screening test for low risk patient  - HCV Ab w/Rflx to Verification; Future  8. COVID-19 vaccination refused Patient education given on importance and efficacy of COVID-19 vaccine   I have reviewed the patient's medical history (PMH, PSH, Social History, Family History, Medications, and allergies) , and have been updated if relevant. I spent 30 minutes reviewing chart and  face to face time with patient.     Follow-up: Return in about 5 days (around 12/30/2020) for Fasting  labs.   Kasandra Knudsen Mayers, PA-C

## 2020-12-25 NOTE — Progress Notes (Unsigned)
Patient reports taking his BP upon waking which resulted 167/101 prior to medication. Patient took two 5 mg amlodipine this morning per the UC to "double up" Patient denies pain at this this time.

## 2020-12-25 NOTE — Patient Instructions (Signed)
You will stop the amlodipine and start taking lisinopril and hydrochlorothiazide.  I encourage you to stay very well-hydrated, and follow a low-sodium diet.  Continue checking your blood pressure on a daily basis, keep a written log and have this available at your next office visit.  Please return to the mobile medicine unit on Monday, March 7 at 10 AM for fasting labs.  We will call you with those lab results.  Please let us know if there is anything else we can do for you  Roney Jaffe, PA-C Physician Assistant [facility] [email or web address]     How to Take Your Blood Pressure Blood pressure is a measurement of how strongly your blood is pressing against the walls of your arteries. Arteries are blood vessels that carry blood from your heart throughout your body. Your health care provider takes your blood pressure at each office visit. You can also take your own blood pressure at home with a blood pressure monitor. You may need to take your own blood pressure to:  Confirm a diagnosis of high blood pressure (hypertension).  Monitor your blood pressure over time.  Make sure your blood pressure medicine is working. Supplies needed:  Blood pressure monitor.  Dining room chair to sit in.  Table or desk.  Small notebook and pencil or pen. How to prepare To get the most accurate reading, avoid the following for 30 minutes before you check your blood pressure:  Drinking caffeine.  Drinking alcohol.  Eating.  Smoking.  Exercising. Five minutes before you check your blood pressure:  Use the bathroom and urinate so that you have an empty bladder.  Sit quietly in a dining room chair. Do not sit in a soft couch or an armchair. Do not talk. How to take your blood pressure To check your blood pressure, follow the instructions in the manual that came with your blood pressure monitor. If you have a digital blood pressure  monitor, the instructions may be as follows: 1. Sit up straight in a chair. 2. Place your feet on the floor. Do not cross your ankles or legs. 3. Rest your left arm at the level of your heart on a table or desk or on the arm of a chair. 4. Pull up your shirt sleeve. 5. Wrap the blood pressure cuff around the upper part of your left arm, 1 inch (2.5 cm) above your elbow. It is best to wrap the cuff around bare skin. 6. Fit the cuff snugly around your arm. You should be able to place only one finger between the cuff and your arm. 7. Position the cord so that it rests in the bend of your elbow. 8. Press the power button. 9. Sit quietly while the cuff inflates and deflates. 10. Read the digital reading on the monitor screen and write the numbers down (record them) in a notebook. 11. Wait 2-3 minutes, then repeat the steps, starting at step 1.   What does my blood pressure reading mean? A blood pressure reading consists of a higher number over a lower number. Ideally, your blood pressure should be below 120/80. The first ("top") number is called the systolic pressure. It is a measure of the pressure in your arteries as your heart beats. The second ("bottom") number is called the diastolic pressure. It is a measure of the pressure in your arteries as the heart relaxes. Blood pressure is classified into five stages. The following are the stages for adults who do not have a  short-term serious illness or a chronic condition. Systolic pressure and diastolic pressure are measured in a unit called mm Hg (millimeters of mercury).  Normal  Systolic pressure: below 120.  Diastolic pressure: below 80. Elevated  Systolic pressure: 120-129.  Diastolic pressure: below 80. Hypertension stage 1  Systolic pressure: 130-139.  Diastolic pressure: 80-89. Hypertension stage 2  Systolic pressure: 140 or above.  Diastolic pressure: 90 or above. You can have elevated blood pressure or hypertension even if only  the systolic or only the diastolic number in your reading is higher than normal. Follow these instructions at home:  Check your blood pressure as often as recommended by your health care provider.  Check your blood pressure at the same time every day.  Take your monitor to the next appointment with your health care provider to make sure that: ? You are using it correctly. ? It provides accurate readings.  Be sure you understand what your goal blood pressure numbers are.  Tell your health care provider if you are having any side effects from blood pressure medicine.  Keep all follow-up visits as told by your health care provider. This is important. General tips  Your health care provider can suggest a reliable monitor that will meet your needs. There are several types of home blood pressure monitors.  Choose a monitor that has an arm cuff. Do not choose a monitor that measures your blood pressure from your wrist or finger.  Choose a cuff that wraps snugly around your upper arm. You should be able to fit only one finger between your arm and the cuff.  You can buy a blood pressure monitor at most drugstores or online. Where to find more information American Heart Association: www.heart.org Contact a health care provider if:  Your blood pressure is consistently high. Get help right away if:  Your systolic blood pressure is higher than 180.  Your diastolic blood pressure is higher than 120. Summary  Blood pressure is a measurement of how strongly your blood is pressing against the walls of your arteries.  A blood pressure reading consists of a higher number over a lower number. Ideally, your blood pressure should be below 120/80.  Check your blood pressure at the same time every day.  Avoid caffeine, alcohol, smoking, and exercise for 30 minutes prior to checking your blood pressure. These agents can affect the accuracy of the blood pressure reading. This information is not  intended to replace advice given to you by your health care provider. Make sure you discuss any questions you have with your health care provider. Document Revised: 10/06/2019 Document Reviewed: 10/06/2019 Elsevier Patient Education  2021 [employer].

## 2020-12-26 DIAGNOSIS — Z8739 Personal history of other diseases of the musculoskeletal system and connective tissue: Secondary | ICD-10-CM | POA: Insufficient documentation

## 2020-12-30 ENCOUNTER — Other Ambulatory Visit: Payer: Self-pay

## 2020-12-30 ENCOUNTER — Other Ambulatory Visit: Payer: 59

## 2020-12-30 DIAGNOSIS — I160 Hypertensive urgency: Secondary | ICD-10-CM

## 2020-12-30 DIAGNOSIS — Z1159 Encounter for screening for other viral diseases: Secondary | ICD-10-CM

## 2020-12-30 DIAGNOSIS — Z125 Encounter for screening for malignant neoplasm of prostate: Secondary | ICD-10-CM

## 2020-12-30 DIAGNOSIS — I10 Essential (primary) hypertension: Secondary | ICD-10-CM

## 2020-12-30 DIAGNOSIS — Z8739 Personal history of other diseases of the musculoskeletal system and connective tissue: Secondary | ICD-10-CM

## 2020-12-30 DIAGNOSIS — Z114 Encounter for screening for human immunodeficiency virus [HIV]: Secondary | ICD-10-CM

## 2020-12-30 DIAGNOSIS — E785 Hyperlipidemia, unspecified: Secondary | ICD-10-CM

## 2020-12-31 LAB — CBC WITH DIFFERENTIAL/PLATELET
Basophils Absolute: 0.1 10*3/uL (ref 0.0–0.2)
Basos: 2 %
EOS (ABSOLUTE): 0.2 10*3/uL (ref 0.0–0.4)
Eos: 2 %
Hematocrit: 45.6 % (ref 37.5–51.0)
Hemoglobin: 15.5 g/dL (ref 13.0–17.7)
Immature Grans (Abs): 0.2 10*3/uL — ABNORMAL HIGH (ref 0.0–0.1)
Immature Granulocytes: 2 %
Lymphocytes Absolute: 2.3 10*3/uL (ref 0.7–3.1)
Lymphs: 25 %
MCH: 30.5 pg (ref 26.6–33.0)
MCHC: 34 g/dL (ref 31.5–35.7)
MCV: 90 fL (ref 79–97)
Monocytes Absolute: 0.9 10*3/uL (ref 0.1–0.9)
Monocytes: 10 %
Neutrophils Absolute: 5.5 10*3/uL (ref 1.4–7.0)
Neutrophils: 59 %
Platelets: 323 10*3/uL (ref 150–450)
RBC: 5.08 x10E6/uL (ref 4.14–5.80)
RDW: 15.8 % — ABNORMAL HIGH (ref 11.6–15.4)
WBC: 9.2 10*3/uL (ref 3.4–10.8)

## 2020-12-31 LAB — COMP. METABOLIC PANEL (12)
AST: 25 IU/L (ref 0–40)
Albumin/Globulin Ratio: 1.8 (ref 1.2–2.2)
Albumin: 4.8 g/dL (ref 3.8–4.9)
Alkaline Phosphatase: 85 IU/L (ref 44–121)
BUN/Creatinine Ratio: 11 (ref 9–20)
BUN: 15 mg/dL (ref 6–24)
Bilirubin Total: 0.7 mg/dL (ref 0.0–1.2)
Calcium: 10.1 mg/dL (ref 8.7–10.2)
Chloride: 98 mmol/L (ref 96–106)
Creatinine, Ser: 1.33 mg/dL — ABNORMAL HIGH (ref 0.76–1.27)
Globulin, Total: 2.6 g/dL (ref 1.5–4.5)
Glucose: 120 mg/dL — ABNORMAL HIGH (ref 65–99)
Potassium: 4.8 mmol/L (ref 3.5–5.2)
Sodium: 136 mmol/L (ref 134–144)
Total Protein: 7.4 g/dL (ref 6.0–8.5)
eGFR: 64 mL/min/{1.73_m2} (ref >59)

## 2020-12-31 LAB — LIPID PANEL
Chol/HDL Ratio: 6.4 ratio — ABNORMAL HIGH (ref 0.0–5.0)
Cholesterol, Total: 231 mg/dL — ABNORMAL HIGH (ref 100–199)
HDL: 36 mg/dL — ABNORMAL LOW (ref >39)
LDL Chol Calc (NIH): 168 mg/dL — ABNORMAL HIGH (ref 0–99)
Triglycerides: 149 mg/dL (ref 0–149)
VLDL Cholesterol Cal: 27 mg/dL (ref 5–40)

## 2020-12-31 LAB — HCV AB W/RFLX TO VERIFICATION: HCV Ab: <0.1 s/co ratio (ref 0.0–0.9)

## 2020-12-31 LAB — HCV INTERPRETATION

## 2020-12-31 LAB — URIC ACID: Uric Acid: 10.7 mg/dL — ABNORMAL HIGH (ref 3.8–8.4)

## 2020-12-31 LAB — HIV ANTIBODY (ROUTINE TESTING W REFLEX): HIV Screen 4th Generation wRfx: NONREACTIVE

## 2020-12-31 LAB — TSH: TSH: 2.2 u[IU]/mL (ref 0.450–4.500)

## 2020-12-31 LAB — PSA: Prostate Specific Ag, Serum: 6.7 ng/mL — ABNORMAL HIGH (ref 0.0–4.0)

## 2021-01-01 ENCOUNTER — Telehealth: Payer: Self-pay

## 2021-01-01 NOTE — Telephone Encounter (Signed)
Reached out to the patient to schedule a virtual visit with the provider to discuss lab results. Staff shared with the patient available times with the provider.

## 2021-01-02 ENCOUNTER — Telehealth: Payer: 59 | Admitting: Physician Assistant

## 2021-01-02 DIAGNOSIS — Z8739 Personal history of other diseases of the musculoskeletal system and connective tissue: Secondary | ICD-10-CM | POA: Diagnosis not present

## 2021-01-02 DIAGNOSIS — E785 Hyperlipidemia, unspecified: Secondary | ICD-10-CM | POA: Diagnosis not present

## 2021-01-02 DIAGNOSIS — R972 Elevated prostate specific antigen [PSA]: Secondary | ICD-10-CM | POA: Diagnosis not present

## 2021-01-02 DIAGNOSIS — E790 Hyperuricemia without signs of inflammatory arthritis and tophaceous disease: Secondary | ICD-10-CM

## 2021-01-02 MED ORDER — ATORVASTATIN CALCIUM 20 MG PO TABS: 20.0000 mg | ORAL_TABLET | Freq: Every day | ORAL | 1 refills | Status: DC

## 2021-01-02 MED ORDER — ALLOPURINOL 100 MG PO TABS: 100.0000 mg | ORAL_TABLET | Freq: Every day | ORAL | 1 refills | Status: DC

## 2021-01-02 NOTE — Patient Instructions (Signed)
For your elevated uric acid levels, you will restart allopurinol 100 mg daily.  You should have this level checked in approximately 2 to 4 weeks to make sure this is a strong enough dose.  I have started a referral for you to be seen by urology for your elevated PSA level.  For your elevated cholesterol, you will start atorvastatin once daily.  I encourage you to follow a low-cholesterol diet.  Please feel free to come by the mobile medicine unit for a check of your A1c due to elevated blood glucose levels.  Please let us know if there is anything else we can do for you  Roney Jaffe, PA-C Physician Assistant [facility] Medicine [email or web address]    Prostate-Specific Antigen Test Why am I having this test? The prostate-specific antigen (PSA) test is a screening test for prostate cancer. It can identify early signs of prostate cancer, which may allow for more effective treatment. Your health care provider may recommend that you have a PSA test starting at age 81 or that you have one earlier or later, depending on your risk factors for prostate cancer. You may also have a PSA test:  To monitor treatment of prostate cancer.  To check whether prostate cancer has returned after treatment.  If you have signs of other conditions that can affect PSA levels, such as: ? An enlarged prostate that is not caused by cancer (benign prostatic hyperplasia, BPH). This condition is very common in older men. ? A prostate infection. What is being tested? This test measures the amount of PSA in your blood. PSA is a protein that is made in the prostate. The prostate naturally produces more PSA as you age, but very high levels may be a sign of a medical condition. What kind of sample is taken? A blood sample is required for this test. It is usually collected by inserting a needle into a blood vessel or by sticking a finger with a small needle. Blood for this  test should be drawn before having an exam of the prostate.   How do I prepare for this test? Do not ejaculate starting 24 hours before your test, or as long as told by your health care provider. Tell a health care provider about:  Any allergies you have.  All medicines you are taking, including vitamins, herbs, eye drops, creams, and over-the-counter medicines. This also includes: ? Medicines to assist with hair growth, such as finasteride. ? Any recent exposure to a medicine called diethylstilbestrol.  Any blood disorders you have.  Any recent procedures you have had, especially any procedures involving the prostate or rectum.  Any medical conditions you have.  Any recent urinary tract infections (UTIs) you have had. How are the results reported? Your test results will be reported as a value that indicates how much PSA is in your blood. This will be given as nanograms of PSA per milliliter of blood (ng/mL). Your health care provider will compare your results to normal ranges that were established after testing a large group of people (reference ranges). Reference ranges may vary among labs and hospitals. PSA levels vary from person to person and generally increase with age. Because of this variation, there is no single PSA value that is considered normal for everyone. Instead, PSA reference ranges are used to describe whether your PSA levels are considered low or high (elevated). Common reference ranges are:  Low: 0-2.5 ng/mL.  Slightly to moderately elevated: 2.6-10.0 ng/mL.  Moderately elevated: 10.0-19.9 ng/mL.  Significantly elevated: 20 ng/mL or greater. Sometimes, the test results may report that a condition is present when it is not present (false-positive result). What do the results mean? A test result that is higher than 4 ng/mL may mean that you are at an increased risk for prostate cancer. However, a PSA test by itself is not enough to diagnose prostate cancer. High PSA  levels may also be caused by the natural aging process, prostate infection, or BPH. PSA screening cannot tell you if your PSA is high due to cancer or a different cause. A prostate biopsy is the only way to diagnose prostate cancer. A risk of having the PSA test is diagnosing and treating prostate cancer that would never have caused any symptoms or problems (overdiagnosis and overtreatment). Talk with your health care provider about what your results mean. Questions to ask your health care provider Ask your health care provider, or the department that is doing the test:  When will my results be ready?  How will I get my results?  What are my treatment options?  What other tests do I need?  What are my next steps? Summary  The prostate-specific antigen (PSA) test is a screening test for prostate cancer.  Your health care provider may recommend that you have a PSA test starting at age 69 or that you have one earlier or later, depending on your risk factors for prostate cancer.  A test result that is higher than 4 ng/mL may mean that you are at an increased risk for prostate cancer. However, elevated levels can be caused by a number of conditions other than prostate cancer.  Talk with your health care provider about what your results mean. This information is not intended to replace advice given to you by your health care provider. Make sure you discuss any questions you have with your health care provider. Document Revised: 06/27/2020 Document Reviewed: 06/27/2020 Elsevier Patient Education  2021 [employer].

## 2021-01-02 NOTE — Progress Notes (Signed)
Established Patient Office Visit  Subjective:  Patient ID: Luis Harrington, male    DOB: 1966-05-22  Age: 55 y.o. MRN: [id number]  CC:  Chief Complaint  Patient presents with  . Results   Virtual Visit via Telephone Note  I connected with Luis Harrington on 01/02/21 at 10:00 AM EST by telephone and verified that I am speaking with the correct person using two identifiers.  Location: Patient: Home  Provider: [facility] Medicine Unit    I discussed the limitations, risks, security and privacy concerns of performing an evaluation and management service by telephone and the availability of in person appointments. I also discussed with the patient that there may be a patient responsible charge related to this service. The patient expressed understanding and agreed to proceed.   History of Present Illness:  Patient was seen on the mobile medicine unit on December 25, 2020 and return for fasting labs.  Results were discussed with patient, patient education given.  No new concerns today  Observations/Objective:  Medical history and current medications reviewed, no physical exam completed   Past Medical History:  Diagnosis Date  . Gout   . Hypertension     Past Surgical History:  Procedure Laterality Date  . TENDON REPAIR     Finger    Family History  Problem Relation Age of Onset  . Diabetes Mother   . Hypertension Mother   . Diabetes Father   . Hypertension Father     Social History   Socioeconomic History  . Marital status: Married    Spouse name: Not on file  . Number of children: Not on file  . Years of education: Not on file  . Highest education level: Not on file  Occupational History  . Not on file  Tobacco Use  . Smoking status: Former Games developer  . Smokeless tobacco: Never Used  Vaping Use  . Vaping Use: Never used  Substance and Sexual Activity  . Alcohol use: Yes  . Drug use: No  . Sexual activity: Yes    Partners: Female  Other Topics  Concern  . Not on file  Social History Narrative  . Not on file   Social Determinants of Health   Financial Resource Strain: Not on file  Food Insecurity: Not on file  Transportation Needs: Not on file  Physical Activity: Not on file  Stress: Not on file  Social Connections: Not on file  Intimate Partner Violence: Not on file    Outpatient Medications Prior to Visit  Medication Sig Dispense Refill  . hydrochlorothiazide (HYDRODIURIL) 25 MG tablet Take 1 tablet (25 mg total) by mouth daily. 90 tablet 0  . lisinopril (ZESTRIL) 10 MG tablet Take 1 tablet (10 mg total) by mouth daily. 90 tablet 0   No facility-administered medications prior to visit.    Allergies  Allergen Reactions  . Shrimp [Shellfish Allergy] Itching    ROS Review of Systems  Constitutional: Negative.   HENT: Negative.   Eyes: Negative.   Respiratory: Negative.   Cardiovascular: Negative.   Gastrointestinal: Negative.   Endocrine: Negative.   Genitourinary: Negative.   Musculoskeletal: Negative.   Skin: Negative.   Allergic/Immunologic: Negative.   Neurological: Negative.   Hematological: Negative.   Psychiatric/Behavioral: Negative.       Objective:     There were no vitals taken for this visit. Wt Readings from Last 3 Encounters:  12/25/20 246 lb (111.6 kg)  11/03/20 240 lb (108.9 kg)  04/06/18 230 lb (104.3  kg)     There are no preventive care reminders to display for this patient.  There are no preventive care reminders to display for this patient.  Lab Results  Component Value Date   TSH 2.200 12/30/2020   Lab Results  Component Value Date   WBC 9.2 12/30/2020   HGB 15.5 12/30/2020   HCT 45.6 12/30/2020   MCV 90 12/30/2020   PLT 323 12/30/2020   Lab Results  Component Value Date   NA 136 12/30/2020   K 4.8 12/30/2020   CO2 29 04/06/2018   GLUCOSE 120 (H) 12/30/2020   BUN 15 12/30/2020   CREATININE 1.33 (H) 12/30/2020   BILITOT 0.7 12/30/2020   ALKPHOS 85  12/30/2020   AST 25 12/30/2020   ALT 34 04/06/2018   PROT 7.4 12/30/2020   ALBUMIN 4.8 12/30/2020   CALCIUM 10.1 12/30/2020   GFR 96.63 04/06/2018   Lab Results  Component Value Date   CHOL 231 (H) 12/30/2020   Lab Results  Component Value Date   HDL 36 (L) 12/30/2020   Lab Results  Component Value Date   LDLCALC 168 (H) 12/30/2020   Lab Results  Component Value Date   TRIG 149 12/30/2020   Lab Results  Component Value Date   CHOLHDL 6.4 (H) 12/30/2020   Lab Results  Component Value Date   HGBA1C 5.7 04/06/2018      Assessment & Plan:   Problem List Items Addressed This Visit      Other   Dyslipidemia   Relevant Medications   atorvastatin (LIPITOR) 20 MG tablet   History of gout   Relevant Medications   allopurinol (ZYLOPRIM) 100 MG tablet    Other Visit Diagnoses    Elevated uric acid in blood    -  Primary   Relevant Medications   allopurinol (ZYLOPRIM) 100 MG tablet   Elevated PSA, less than 10 ng/ml       Relevant Orders   Ambulatory referral to Urology      Meds ordered this encounter  Medications  . allopurinol (ZYLOPRIM) 100 MG tablet    Sig: Take 1 tablet (100 mg total) by mouth daily. Start after flare resolves    Dispense:  30 tablet    Refill:  1    Order Specific Question:   Supervising Provider    Answer:   WRIGHT, PATRICK E [[id number]]  . atorvastatin (LIPITOR) 20 MG tablet    Sig: Take 1 tablet (20 mg total) by mouth daily.    Dispense:  30 tablet    Refill:  1    Order Specific Question:   Supervising Provider    Answer:   Shan Levans E [[id number]]    Assessment and Plan: 1. Elevated uric acid in blood Resume allopurinol, patient to return to mobile unit to have uric acid levels rechecked in 2 to 4 weeks.  Patient education given on preventative measures. - allopurinol (ZYLOPRIM) 100 MG tablet; Take 1 tablet (100 mg total) by mouth daily. Start after flare resolves  Dispense: 30 tablet; Refill: 1  2. Elevated PSA, less than 10  ng/ml  - Ambulatory referral to Urology  3. History of gout  - allopurinol (ZYLOPRIM) 100 MG tablet; Take 1 tablet (100 mg total) by mouth daily. Start after flare resolves  Dispense: 30 tablet; Refill: 1  4. Dyslipidemia Patient education given on elevated risk of cardiovascular event in the next 10 years.  Patient has quit smoking.  Patient agreeable to trial of atorvastatin. -  atorvastatin (LIPITOR) 20 MG tablet; Take 1 tablet (20 mg total) by mouth daily.  Dispense: 30 tablet; Refill: 1    Follow Up Instructions:    I discussed the assessment and treatment plan with the patient. The patient was provided an opportunity to ask questions and all were answered. The patient agreed with the plan and demonstrated an understanding of the instructions.   The patient was advised to call back or seek an in-person evaluation if the symptoms worsen or if the condition fails to improve as anticipated.  I provided 21 minutes of non-face-to-face time during this encounter.  Follow-up: Return if symptoms worsen or fail to improve.    Kasandra Knudsen Mayers, PA-C

## 2021-02-05 ENCOUNTER — Other Ambulatory Visit: Payer: Self-pay

## 2021-02-05 ENCOUNTER — Encounter: Payer: Self-pay | Admitting: Family

## 2021-02-05 ENCOUNTER — Ambulatory Visit (INDEPENDENT_AMBULATORY_CARE_PROVIDER_SITE_OTHER): Payer: PRIVATE HEALTH INSURANCE | Admitting: Family

## 2021-02-05 VITALS — BP 125/80 | HR 81 | Ht 71.42 in | Wt 239.6 lb

## 2021-02-05 DIAGNOSIS — Z7689 Persons encountering health services in other specified circumstances: Secondary | ICD-10-CM | POA: Diagnosis not present

## 2021-02-05 DIAGNOSIS — I10 Essential (primary) hypertension: Secondary | ICD-10-CM | POA: Diagnosis not present

## 2021-02-05 NOTE — Progress Notes (Signed)
Establish care

## 2021-02-05 NOTE — Progress Notes (Signed)
Subjective:    Luis Harrington - 55 y.o. male MRN [id number]  Date of birth: [birth year]  HPI  Luis Harrington is to establish care. Patient has a PMH significant for essential hypertension, gout, hyperglycemia, dyslipidemia, and history of gout.   Current issues and/or concerns: 1. HYPERTENSION FOLLOW-UP: 12/25/2020 per PA note: Patient education given on lifestyle modifications. On review of patient's chart, he previously was taking lisinopril hydrochlorothiazide combination.  Patient does have history of gout, patient education given on importance of alcohol avoidance, continue maintaining proper hydration.  Patient encouraged to check blood pressure on a daily basis, keep a written log and bring to follow-up visit. Patient will be given appointment to establish care at [facility], patient to return to the mobile unit on March 7 for fasting. Red flags given for prompt evaluation at emergency department, no hypertensive symptoms present.  - CBC with Differential/Platelet; Future - Comp. Metabolic Panel (12); Future - TSH; Future - lisinopril (ZESTRIL) 10 MG tablet; Take 1 tablet (10 mg total) by mouth daily.  Dispense: 90 tablet; Refill: 0 - hydrochlorothiazide (HYDRODIURIL) 25 MG tablet; Take 1 tablet (25 mg total) by mouth daily.  Dispense: 90 tablet; Refill: 0 - cloNIDine (CATAPRES) tablet 0.2 mg  02/05/2021: Currently taking: see medication list Med Adherence: [x]  Yes    []  No Medication side effects: []  Yes    [x]  No Adherence with salt restriction (low-salt diet): [x]  Yes    []  No Exercise: while at work Home Monitoring?: [x]  Yes    []  No Home BP results range: fluctuates Smoking []  Yes [x]  No SOB? []  Yes    [x]  No Chest Pain?: []  Yes    [x]  No Leg swelling?: []  Yes    [x]  No Headaches?: []  Yes    [x]  No Dizziness? []  Yes    [x]  No Comments:  Asked provider if it is ok for him to return to , MD for management of blood pressure and primary care. Reports he  is unsure if he wants to return to this office for follow-up and management of primary care. Confirmed with asking provider if she will be his primary provider.  Reports that he feels that when he goes to see different providers they all do the same thing or nothing at all and it ends up being a waste of time and his health insurance benefits. States he would rather spend his health insurance benefits towards getting labs. Today asking for labs to check kidney function.   Reports that he was rescheduled several times for today's appointment. Reports he had to wait 1 month for today's appointment. Reports he did not like waiting in lobby for almost 1 hour.     ROS per HPI   Health Maintenance:  Health Maintenance Due  Topic Date Due  . COVID-19 Vaccine (1) Never done    Past Medical History: Patient Active Problem List   Diagnosis Date Noted  . History of gout 12/26/2020  . Hyperglycemia 04/07/2018  . Dyslipidemia 04/07/2018  . Gout 04/06/2018  . Essential hypertension 04/06/2018    Social History   reports that he has quit smoking. He has never used smokeless tobacco. He reports current alcohol use. He reports that he does not use drugs.   Family History  family history includes Diabetes in his father and mother; Hypertension in his father and mother.   Medications: reviewed and updated   Objective:   Physical Exam BP 125/80 (BP Location: Left Arm, Patient Position: Sitting)  Pulse 81   Ht 5' 11.42" (1.814 m)   Wt 239 lb 9.6 oz (108.7 kg)   SpO2 99%   BMI 33.03 kg/m  Physical Exam Constitutional:      Appearance: He is obese.  HENT:     Head: Normocephalic and atraumatic.  Eyes:     Extraocular Movements: Extraocular movements intact.     Conjunctiva/sclera: Conjunctivae normal.     Pupils: Pupils are equal, round, and reactive to light.  Cardiovascular:     Rate and Rhythm: Normal rate and regular rhythm.     Pulses: Normal pulses.     Heart sounds: Normal  heart sounds.  Pulmonary:     Effort: Pulmonary effort is normal.     Breath sounds: Normal breath sounds.  Musculoskeletal:     Cervical back: Normal range of motion and neck supple.  Neurological:     General: No focal deficit present.     Mental Status: He is alert and oriented to person, place, and time.  Psychiatric:        Mood and Affect: Affect is angry.        Behavior: Behavior is agitated.      Assessment & Plan:  1. Encounter to establish care: - Patient presents today to establish care.  - Today patient asked provider if it is ok for him to return to Jacquiline Doe, MD for management of blood pressure and primary care. Reports he is unsure if he wants to return to this office for follow-up and management of primary care.  - Counseled patient to make the best decision for himself regarding who he would like to be his primary provider.   2. Essential hypertension: - Blood pressure today at goal. - Patient reports home blood pressures fluctuates. Patient presents to office today without home blood pressure log as recommended from previous appointment on 12/25/2020. - Continue Hydrochlorothiazide 25 mg daily as prescribed.  - Continue Lisinopril 10 mg daily as prescribed.  - Counseled on blood pressure goal of less than 130/80, low-sodium, DASH diet, medication compliance, 150 minutes of moderate intensity exercise per week as tolerated. Discussed medication compliance, adverse effects. - Last CMP obtained 12/30/2020, resulted normal. - Last CBC obtained 12/30/2020, resulted normal. - Follow-up with primary provider as scheduled. Counseled to bring home blood pressure log to next appointment. Patient agreeable.    Patient was given clear instructions to go to Emergency Department or return to medical center if symptoms don't improve, worsen, or new problems develop.The patient verbalized understanding.  I discussed the assessment and treatment plan with the patient. The patient was  provided an opportunity to ask questions and all were answered. The patient agreed with the plan and demonstrated an understanding of the instructions.   The patient was advised to call back or seek an in-person evaluation if the symptoms worsen or if the condition fails to improve as anticipated.    Ricky Stabs, NP 02/05/2021, 11:39 AM [facility]

## 2021-02-05 NOTE — Patient Instructions (Signed)
- Return for annual physical examination, labs, and health maintenance. Arrive fasting meaning having no for at least 8 hours prior to appointment. You may have only water or black coffee. Please take scheduled medications as normal. Thank you for choosing Primary Care at [facility] for your medical home!    Luis Harrington was seen by Rema Fendt, NP today.   Luis Harrington's primary care provider is Rema Fendt, NP.   For the best care possible,  you should try to see Ricky Stabs, NP whenever you come to clinic.   We look forward to seeing you again soon!  If you have any questions about your visit today,  please call us at [phone]  Or feel free to reach your provider via MyChart.    DASH Eating Plan DASH stands for Dietary Approaches to Stop Hypertension. The DASH eating plan is a healthy eating plan that has been shown to:  Reduce high blood pressure (hypertension).  Reduce your risk for type 2 diabetes, heart disease, and stroke.  Help with weight loss. What are tips for following this plan? Reading food labels  Check food labels for the amount of salt (sodium) per serving. Choose foods with less than 5 percent of the Daily Value of sodium. Generally, foods with less than 300 milligrams (mg) of sodium per serving fit into this eating plan.  To find whole grains, look for the word "whole" as the first word in the ingredient list. Shopping  Buy products labeled as "low-sodium" or "no salt added."  Buy fresh foods. Avoid canned foods and pre-made or frozen meals. Cooking  Avoid adding salt when cooking. Use salt-free seasonings or herbs instead of table salt or sea salt. Check with your health care provider or pharmacist before using salt substitutes.  Do not fry foods. Cook foods using healthy methods such as baking, boiling, grilling, roasting, and broiling instead.  Cook with heart-healthy oils, such as olive, canola, avocado, soybean, or sunflower  oil. Meal planning  Eat a balanced diet that includes: ? 4 or more servings of fruits and 4 or more servings of vegetables each day. Try to fill one-half of your plate with fruits and vegetables. ? 6-8 servings of whole grains each day. ? Less than 6 oz (170 g) of lean meat, poultry, or fish each day. A 3-oz (85-g) serving of meat is about the same size as a deck of cards. One egg equals 1 oz (28 g). ? 2-3 servings of low-fat dairy each day. One serving is 1 cup (237 mL). ? 1 serving of nuts, seeds, or beans 5 times each week. ? 2-3 servings of heart-healthy fats. Healthy fats called omega-3 fatty acids are found in foods such as walnuts, flaxseeds, fortified milks, and eggs. These fats are also found in cold-water fish, such as sardines, salmon, and mackerel.  Limit how much you eat of: ? Canned or prepackaged foods. ? Food that is high in trans fat, such as some fried foods. ? Food that is high in saturated fat, such as fatty meat. ? Desserts and other sweets, sugary drinks, and other foods with added sugar. ? Full-fat dairy products.  Do not salt foods before eating.  Do not eat more than 4 egg yolks a week.  Try to eat at least 2 vegetarian meals a week.  Eat more home-cooked food and less restaurant, buffet, and fast food.   Lifestyle  When eating at a restaurant, ask that your food be prepared with less  salt or no salt, if possible.  If you drink alcohol: ? Limit how much you use to:  0-1 drink a day for women who are not pregnant.  0-2 drinks a day for men. ? Be aware of how much alcohol is in your drink. In the [place] one drink equals one 12 oz bottle of beer (355 mL), one 5 oz glass of wine (148 mL), or one 1 oz glass of hard liquor (44 mL). General information  Avoid eating more than 2,300 mg of salt a day. If you have hypertension, you may need to reduce your sodium intake to 1,500 mg a day.  Work with your health care provider to maintain a healthy body weight or  to lose weight. Ask what an ideal weight is for you.  Get at least 30 minutes of exercise that causes your heart to beat faster (aerobic exercise) most days of the week. Activities may include walking, swimming, or biking.  Work with your health care provider or dietitian to adjust your eating plan to your individual calorie needs. What foods should I eat? Fruits All fresh, dried, or frozen fruit. Canned fruit in natural juice (without added sugar). Vegetables Fresh or frozen vegetables (raw, steamed, roasted, or grilled). Low-sodium or reduced-sodium tomato and vegetable juice. Low-sodium or reduced-sodium tomato sauce and tomato paste. Low-sodium or reduced-sodium canned vegetables. Grains Whole-grain or whole-wheat bread. Whole-grain or whole-wheat pasta. Brown rice. [place]. Bulgur. Whole-grain and low-sodium cereals. Pita bread. Low-fat, low-sodium crackers. Whole-wheat flour tortillas. Meats and other proteins Skinless chicken or [place]. Ground chicken or [place]. Pork with fat trimmed off. Fish and seafood. Egg whites. Dried beans, peas, or lentils. Unsalted nuts, nut butters, and seeds. Unsalted canned beans. Lean cuts of beef with fat trimmed off. Low-sodium, lean precooked or cured meat, such as sausages or meat loaves. Dairy Low-fat (1%) or fat-free (skim) milk. Reduced-fat, low-fat, or fat-free cheeses. Nonfat, low-sodium ricotta or cottage cheese. Low-fat or nonfat yogurt. Low-fat, low-sodium cheese. Fats and oils Soft margarine without trans fats. Vegetable oil. Reduced-fat, low-fat, or light mayonnaise and salad dressings (reduced-sodium). Canola, safflower, olive, avocado, soybean, and sunflower oils. Avocado. Seasonings and condiments Herbs. Spices. Seasoning mixes without salt. Other foods Unsalted popcorn and pretzels. Fat-free sweets. The items listed above may not be a complete list of foods and beverages you can eat. Contact a dietitian for more information. What  foods should I avoid? Fruits Canned fruit in a light or heavy syrup. Fried fruit. Fruit in cream or butter sauce. Vegetables Creamed or fried vegetables. Vegetables in a cheese sauce. Regular canned vegetables (not low-sodium or reduced-sodium). Regular canned tomato sauce and paste (not low-sodium or reduced-sodium). Regular tomato and vegetable juice (not low-sodium or reduced-sodium). Angie Fava. Olives. Grains Baked goods made with fat, such as croissants, muffins, or some breads. Dry pasta or rice meal packs. Meats and other proteins Fatty cuts of meat. Ribs. Fried meat. Berniece Salines. Bologna, salami, and other precooked or cured meats, such as sausages or meat loaves. Fat from the back of a pig (fatback). Bratwurst. Salted nuts and seeds. Canned beans with added salt. Canned or smoked fish. Whole eggs or egg yolks. Chicken or [place] with skin. Dairy Whole or 2% milk, cream, and half-and-half. Whole or full-fat cream cheese. Whole-fat or sweetened yogurt. Full-fat cheese. Nondairy creamers. Whipped toppings. Processed cheese and cheese spreads. Fats and oils Butter. Stick margarine. Lard. Shortening. Ghee. Bacon fat. Tropical oils, such as coconut, palm kernel, or palm oil. Seasonings and condiments Onion salt,  garlic salt, seasoned salt, table salt, and sea salt. Worcestershire sauce. Tartar sauce. Barbecue sauce. Teriyaki sauce. Soy sauce, including reduced-sodium. Steak sauce. Canned and packaged gravies. Fish sauce. Oyster sauce. Cocktail sauce. Store-bought horseradish. Ketchup. Mustard. Meat flavorings and tenderizers. Bouillon cubes. Hot sauces. Pre-made or packaged marinades. Pre-made or packaged taco seasonings. Relishes. Regular salad dressings. Other foods Salted popcorn and pretzels. The items listed above may not be a complete list of foods and beverages you should avoid. Contact a dietitian for more information. Where to find more information  National Heart, Lung, and Blood Institute:  https://wilson-eaton.com/  American Heart Association: www.heart.org  Academy of Nutrition and Dietetics: www.eatright.[place]: www.kidney.org Summary  The DASH eating plan is a healthy eating plan that has been shown to reduce high blood pressure (hypertension). It may also reduce your risk for type 2 diabetes, heart disease, and stroke.  When on the DASH eating plan, aim to eat more fresh fruits and vegetables, whole grains, lean proteins, low-fat dairy, and heart-healthy fats.  With the DASH eating plan, you should limit salt (sodium) intake to 2,300 mg a day. If you have hypertension, you may need to reduce your sodium intake to 1,500 mg a day.  Work with your health care provider or dietitian to adjust your eating plan to your individual calorie needs. This information is not intended to replace advice given to you by your health care provider. Make sure you discuss any questions you have with your health care provider. Document Revised: 09/15/2019 Document Reviewed: 09/15/2019 Elsevier Patient Education  2021 [employer].

## 2021-02-07 ENCOUNTER — Ambulatory Visit: Payer: 59 | Admitting: Family

## 2021-02-26 ENCOUNTER — Ambulatory Visit: Payer: 59 | Admitting: Urology

## 2021-02-26 DIAGNOSIS — R972 Elevated prostate specific antigen [PSA]: Secondary | ICD-10-CM

## 2021-03-02 ENCOUNTER — Other Ambulatory Visit: Payer: Self-pay | Admitting: Physician Assistant

## 2021-03-02 DIAGNOSIS — E790 Hyperuricemia without signs of inflammatory arthritis and tophaceous disease: Secondary | ICD-10-CM

## 2021-03-02 DIAGNOSIS — E785 Hyperlipidemia, unspecified: Secondary | ICD-10-CM

## 2021-03-02 DIAGNOSIS — Z8739 Personal history of other diseases of the musculoskeletal system and connective tissue: Secondary | ICD-10-CM

## 2021-03-02 DIAGNOSIS — I10 Essential (primary) hypertension: Secondary | ICD-10-CM

## 2021-11-26 ENCOUNTER — Other Ambulatory Visit: Payer: Self-pay

## 2021-11-26 ENCOUNTER — Encounter: Payer: Self-pay | Admitting: Family

## 2021-11-26 ENCOUNTER — Ambulatory Visit (INDEPENDENT_AMBULATORY_CARE_PROVIDER_SITE_OTHER): Payer: PRIVATE HEALTH INSURANCE | Admitting: Family

## 2021-11-26 VITALS — BP 121/80 | HR 100 | Temp 98.3°F | Resp 18 | Ht 71.5 in | Wt 227.0 lb

## 2021-11-26 DIAGNOSIS — I10 Essential (primary) hypertension: Secondary | ICD-10-CM

## 2021-11-26 MED ORDER — HYDROCHLOROTHIAZIDE 25 MG PO TABS: 25.0000 mg | ORAL_TABLET | Freq: Every day | ORAL | 0 refills | Status: DC

## 2021-11-26 MED ORDER — LISINOPRIL 10 MG PO TABS: 10.0000 mg | ORAL_TABLET | Freq: Every day | ORAL | 0 refills | Status: DC

## 2021-11-26 NOTE — Patient Instructions (Signed)
Blood pressure normal. Continue current regimen. Follow-up with primary care in 3 months or sooner if needed.  Hypertension, Adult Hypertension is another name for high blood pressure. High blood pressure forces your heart to work harder to pump blood. This can cause problems over time. There are two numbers in a blood pressure reading. There is a top number (systolic) over a bottom number (diastolic). It is best to have a blood pressure that is below 120/80. Healthy choices can help lower your blood pressure, or you may need medicine to help lower it. What are the causes? The cause of this condition is not known. Some conditions may be related to high blood pressure. What increases the risk? Smoking. Having type 2 diabetes mellitus, high cholesterol, or both. Not getting enough exercise or physical activity. Being overweight. Having too much fat, sugar, calories, or salt (sodium) in your diet. Drinking too much alcohol. Having long-term (chronic) kidney disease. Having a family history of high blood pressure. Age. Risk increases with age. Race. You may be at higher risk if you are African American. Gender. Men are at higher risk than women before age 80. After age 88, women are at higher risk than men. Having obstructive sleep apnea. Stress. What are the signs or symptoms? High blood pressure may not cause symptoms. Very high blood pressure (hypertensive crisis) may cause: Headache. Feelings of worry or nervousness (anxiety). Shortness of breath. Nosebleed. A feeling of being sick to your stomach (nausea). Throwing up (vomiting). Changes in how you see. Very bad chest pain. Seizures. How is this treated? This condition is treated by making healthy lifestyle changes, such as: Eating healthy foods. Exercising more. Drinking less alcohol. Your health care provider may prescribe medicine if lifestyle changes are not enough to get your blood pressure under control, and if: Your top  number is above 130. Your bottom number is above 80. Your personal target blood pressure may vary. Follow these instructions at home: Eating and drinking  If told, follow the DASH eating plan. To follow this plan: Fill one half of your plate at each meal with fruits and vegetables. Fill one fourth of your plate at each meal with whole grains. Whole grains include whole-wheat pasta, brown rice, and whole-grain bread. Eat or drink low-fat dairy products, such as skim milk or low-fat yogurt. Fill one fourth of your plate at each meal with low-fat (lean) proteins. Low-fat proteins include fish, chicken without skin, eggs, beans, and tofu. Avoid fatty meat, cured and processed meat, or chicken with skin. Avoid pre-made or processed food. Eat less than 1,500 mg of salt each day. Do not drink alcohol if: Your doctor tells you not to drink. You are pregnant, may be pregnant, or are planning to become pregnant. If you drink alcohol: Limit how much you use to: 0-1 drink a day for women. 0-2 drinks a day for men. Be aware of how much alcohol is in your drink. In the [place] one drink equals one 12 oz bottle of beer (355 mL), one 5 oz glass of wine (148 mL), or one 1 oz glass of hard liquor (44 mL). Lifestyle  Work with your doctor to stay at a healthy weight or to lose weight. Ask your doctor what the best weight is for you. Get at least 30 minutes of exercise most days of the week. This may include walking, swimming, or biking. Get at least 30 minutes of exercise that strengthens your muscles (resistance exercise) at least 3 days a week. This  may include lifting weights or doing Pilates. Do not use any products that contain nicotine or tobacco, such as cigarettes, e-cigarettes, and chewing tobacco. If you need help quitting, ask your doctor. Check your blood pressure at home as told by your doctor. Keep all follow-up visits as told by your doctor. This is important. Medicines Take  over-the-counter and prescription medicines only as told by your doctor. Follow directions carefully. Do not skip doses of blood pressure medicine. The medicine does not work as well if you skip doses. Skipping doses also puts you at risk for problems. Ask your doctor about side effects or reactions to medicines that you should watch for. Contact a doctor if you: Think you are having a reaction to the medicine you are taking. Have headaches that keep coming back (recurring). Feel dizzy. Have swelling in your ankles. Have trouble with your vision. Get help right away if you: Get a very bad headache. Start to feel mixed up (confused). Feel weak or numb. Feel faint. Have very bad pain in your: Chest. Belly (abdomen). Throw up more than once. Have trouble breathing. Summary Hypertension is another name for high blood pressure. High blood pressure forces your heart to work harder to pump blood. For most people, a normal blood pressure is less than 120/80. Making healthy choices can help lower blood pressure. If your blood pressure does not get lower with healthy choices, you may need to take medicine. This information is not intended to replace advice given to you by your health care provider. Make sure you discuss any questions you have with your health care provider. Document Revised: 06/22/2018 Document Reviewed: 06/22/2018 Elsevier Patient Education  2022 [employer].

## 2021-11-26 NOTE — Progress Notes (Signed)
Patient ID: Luis Harrington, male    DOB: 04/01/1966  MRN: [id number]  CC: Hypertension Follow-Up  Subjective: Luis Harrington is a 56 y.o. male who presents for hypertension follow-up.   His concerns today include:  HYPERTENSION FOLLOW-UP: 02/05/2021: - Continue Hydrochlorothiazide 25 mg daily as prescribed.  - Continue Lisinopril 10 mg daily as prescribed.   11/26/2021: Reports since last appointment seen by [facility] mobile medicine for management of blood pressure. Home blood pressures normal. No longer smoking or drinking alcohol. He does exercise. 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 Reports thinks the blood pressure cuff was in fact a child's cuff which made the blood pressure reading higher than normal. Referred to primary care for blood pressure checkup. Reports prefers to check blood pressure on the right arm.    Patient Active Problem List   Diagnosis Date Noted   History of gout 12/26/2020   Hyperglycemia 04/07/2018   Dyslipidemia 04/07/2018   Gout 04/06/2018   Essential hypertension 04/06/2018     Current Outpatient Medications on File Prior to Visit  Medication Sig Dispense Refill   allopurinol (ZYLOPRIM) 100 MG tablet TAKE 1 TABLET(100 MG) BY MOUTH DAILY. START AFTER FLARE RESOLVES 30 tablet 1   atorvastatin (LIPITOR) 20 MG tablet TAKE 1 TABLET(20 MG) BY MOUTH DAILY 30 tablet 1   [DISCONTINUED] colchicine 0.6 MG tablet Take 2 tablets at first sign of flare, 1 tablet 1 hour later on day 1; then 1-2 tablets daily until flare resolves 12 tablet 0   No current facility-administered medications on file prior to visit.    Allergies  Allergen Reactions   Shrimp [Shellfish Allergy] Itching    Social History   Socioeconomic History   Marital status: Married    Spouse name: Not on file   Number of children: Not on file   Years of education: Not on file   Highest education level: Not on file   Occupational History   Not on file  Tobacco Use   Smoking status: Former   Smokeless tobacco: Never  Vaping Use   Vaping Use: Never used  Substance and Sexual Activity   Alcohol use: Yes   Drug use: No   Sexual activity: Yes    Partners: Female  Other Topics Concern   Not on file  Social History Narrative   Not on file   Social Determinants of Health   Financial Resource Strain: Not on file  Food Insecurity: Not on file  Transportation Needs: Not on file  Physical Activity: Not on file  Stress: Not on file  Social Connections: Not on file  Intimate Partner Violence: Not on file    Family History  Problem Relation Age of Onset   Diabetes Mother    Hypertension Mother    Diabetes Father    Hypertension Father     Past Surgical History:  Procedure Laterality Date   TENDON REPAIR     Finger    ROS: Review of Systems Negative except as stated above  PHYSICAL EXAM: BP 121/80 (BP Location: Right Arm, Patient Position: Sitting, Cuff Size: Large)    Pulse 100    Temp 98.3 F (36.8 C)    Resp 18    Ht 5' 11.5" (1.816 m)    Wt 227 lb (103 kg)    SpO2 98%    BMI 31.22 kg/m   Physical Exam HENT:     Head: Normocephalic and atraumatic.  Eyes:  Extraocular Movements: Extraocular movements intact.     Conjunctiva/sclera: Conjunctivae normal.     Pupils: Pupils are equal, round, and reactive to light.  Cardiovascular:     Rate and Rhythm: Normal rate and regular rhythm.     Pulses: Normal pulses.     Heart sounds: Normal heart sounds.  Pulmonary:     Effort: Pulmonary effort is normal.     Breath sounds: Normal breath sounds.  Musculoskeletal:     Cervical back: Normal range of motion and neck supple.  Neurological:     General: No focal deficit present.     Mental Status: He is alert and oriented to person, place, and time.  Psychiatric:        Mood and Affect: Mood normal.        Behavior: Behavior normal.   ASSESSMENT AND PLAN: 1. Essential (primary)  hypertension: - Blood pressure at goal today in office.  - Continue Hydrochlorothiazide and Lisinopril as prescribed.  - Counseled on blood pressure goal of less than 130/80, low-sodium, DASH diet, medication compliance, 150 minutes of moderate intensity exercise per week as tolerated. Discussed medication compliance, adverse effects. - Follow-up with primary provider in 3 months or sooner if needed.  - hydrochlorothiazide (HYDRODIURIL) 25 MG tablet; Take 1 tablet (25 mg total) by mouth daily.  Dispense: 90 tablet; Refill: 0 - lisinopril (ZESTRIL) 10 MG tablet; Take 1 tablet (10 mg total) by mouth daily.  Dispense: 90 tablet; Refill: 0   Patient was given the opportunity to ask questions.  Patient verbalized understanding of the plan and was able to repeat key elements of the plan. Patient was given clear instructions to go to Emergency Department or return to medical center if symptoms don't improve, worsen, or new problems develop.The patient verbalized understanding.  Requested Prescriptions   Signed Prescriptions Disp Refills   hydrochlorothiazide (HYDRODIURIL) 25 MG tablet 90 tablet 0    Sig: Take 1 tablet (25 mg total) by mouth daily.   lisinopril (ZESTRIL) 10 MG tablet 90 tablet 0    Sig: Take 1 tablet (10 mg total) by mouth daily.    Return in about 3 months (around 02/23/2022) for Follow-Up or next available hypertension .  Rema Fendt, NP

## 2021-11-26 NOTE — Progress Notes (Signed)
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

## 2022-07-12 ENCOUNTER — Other Ambulatory Visit: Payer: Self-pay

## 2022-07-12 ENCOUNTER — Emergency Department (HOSPITAL_COMMUNITY)
Admission: EM | Admit: 2022-07-12 | Discharge: 2022-07-12 | Disposition: A | Discharge status: Home / Self Care | Payer: BC Managed Care – PPO | Attending: Emergency Medicine | Admitting: Emergency Medicine

## 2022-07-12 ENCOUNTER — Emergency Department (HOSPITAL_COMMUNITY): Payer: BC Managed Care – PPO

## 2022-07-12 ENCOUNTER — Encounter (HOSPITAL_COMMUNITY): Payer: Self-pay | Admitting: Emergency Medicine

## 2022-07-12 DIAGNOSIS — Z20822 Contact with and (suspected) exposure to covid-19: Secondary | ICD-10-CM | POA: Diagnosis not present

## 2022-07-12 DIAGNOSIS — I129 Hypertensive chronic kidney disease with stage 1 through stage 4 chronic kidney disease, or unspecified chronic kidney disease: Secondary | ICD-10-CM | POA: Insufficient documentation

## 2022-07-12 DIAGNOSIS — R079 Chest pain, unspecified: Secondary | ICD-10-CM | POA: Diagnosis not present

## 2022-07-12 DIAGNOSIS — I2699 Other pulmonary embolism without acute cor pulmonale: Secondary | ICD-10-CM | POA: Insufficient documentation

## 2022-07-12 DIAGNOSIS — I10 Essential (primary) hypertension: Secondary | ICD-10-CM

## 2022-07-12 DIAGNOSIS — N1831 Chronic kidney disease, stage 3a: Secondary | ICD-10-CM | POA: Insufficient documentation

## 2022-07-12 DIAGNOSIS — R778 Other specified abnormalities of plasma proteins: Secondary | ICD-10-CM | POA: Insufficient documentation

## 2022-07-12 DIAGNOSIS — Z7901 Long term (current) use of anticoagulants: Secondary | ICD-10-CM | POA: Insufficient documentation

## 2022-07-12 DIAGNOSIS — D72829 Elevated white blood cell count, unspecified: Secondary | ICD-10-CM | POA: Insufficient documentation

## 2022-07-12 DIAGNOSIS — R0789 Other chest pain: Secondary | ICD-10-CM | POA: Diagnosis not present

## 2022-07-12 LAB — HEPATIC FUNCTION PANEL
ALT: 22 U/L (ref 0–44)
AST: 27 U/L (ref 15–41)
Albumin: 3.8 g/dL (ref 3.5–5.0)
Alkaline Phosphatase: 53 U/L (ref 38–126)
Bilirubin, Direct: 0.2 mg/dL (ref 0.0–0.2)
Indirect Bilirubin: 1.5 mg/dL — ABNORMAL HIGH (ref 0.3–0.9)
Total Bilirubin: 1.7 mg/dL — ABNORMAL HIGH (ref 0.3–1.2)
Total Protein: 7.1 g/dL (ref 6.5–8.1)

## 2022-07-12 LAB — CBC
HCT: 40.8 % (ref 39.0–52.0)
Hemoglobin: 14.3 g/dL (ref 13.0–17.0)
MCH: 32.9 pg (ref 26.0–34.0)
MCHC: 35 g/dL (ref 30.0–36.0)
MCV: 93.8 fL (ref 80.0–100.0)
Platelets: 256 10*3/uL (ref 150–400)
RBC: 4.35 MIL/uL (ref 4.22–5.81)
RDW: 15.9 % — ABNORMAL HIGH (ref 11.5–15.5)
WBC: 16.1 10*3/uL — ABNORMAL HIGH (ref 4.0–10.5)
nRBC: 0 % (ref 0.0–0.2)

## 2022-07-12 LAB — BASIC METABOLIC PANEL
Anion gap: 10 (ref 5–15)
BUN: 10 mg/dL (ref 6–20)
CO2: 26 mmol/L (ref 22–32)
Calcium: 9.5 mg/dL (ref 8.9–10.3)
Chloride: 103 mmol/L (ref 98–111)
Creatinine, Ser: 1.61 mg/dL — ABNORMAL HIGH (ref 0.61–1.24)
GFR, Estimated: 50 mL/min — ABNORMAL LOW (ref >60)
Glucose, Bld: 137 mg/dL — ABNORMAL HIGH (ref 70–99)
Potassium: 4.4 mmol/L (ref 3.5–5.1)
Sodium: 139 mmol/L (ref 135–145)

## 2022-07-12 LAB — URINALYSIS, ROUTINE W REFLEX MICROSCOPIC
Bilirubin Urine: NEGATIVE
Glucose, UA: NEGATIVE mg/dL
Ketones, ur: NEGATIVE mg/dL
Leukocytes,Ua: NEGATIVE
Nitrite: NEGATIVE
Protein, ur: NEGATIVE mg/dL
Specific Gravity, Urine: 1.016 (ref 1.005–1.030)
pH: 5 (ref 5.0–8.0)

## 2022-07-12 LAB — RESP PANEL BY RT-PCR (FLU A&B, COVID) ARPGX2
Influenza A by PCR: NEGATIVE
Influenza B by PCR: NEGATIVE
SARS Coronavirus 2 by RT PCR: NEGATIVE

## 2022-07-12 LAB — LIPASE, BLOOD: Lipase: 29 U/L (ref 11–51)

## 2022-07-12 LAB — TROPONIN I (HIGH SENSITIVITY)
Troponin I (High Sensitivity): 21 ng/L — ABNORMAL HIGH (ref <18)
Troponin I (High Sensitivity): 17 ng/L (ref <18)

## 2022-07-12 MED ORDER — ONDANSETRON HCL 4 MG/2ML IJ SOLN
4.0000 mg | Freq: Once | INTRAMUSCULAR | Status: AC
  Administered 2022-07-12: 4 mg via INTRAVENOUS
  Filled 2022-07-12: qty 2

## 2022-07-12 MED ORDER — SODIUM CHLORIDE 0.9 % IV BOLUS
1000.0000 mL | Freq: Once | INTRAVENOUS | Status: AC
  Administered 2022-07-12: 1000 mL via INTRAVENOUS

## 2022-07-12 MED ORDER — MORPHINE SULFATE (PF) 4 MG/ML IV SOLN
4.0000 mg | Freq: Once | INTRAVENOUS | Status: AC
  Administered 2022-07-12: 4 mg via INTRAVENOUS
  Filled 2022-07-12: qty 1

## 2022-07-12 MED ORDER — IOHEXOL 350 MG/ML SOLN
80.0000 mL | Freq: Once | INTRAVENOUS | Status: AC | PRN
  Administered 2022-07-12: 80 mL via INTRAVENOUS

## 2022-07-12 MED ORDER — HYDROMORPHONE HCL 1 MG/ML IJ SOLN
1.0000 mg | Freq: Once | INTRAMUSCULAR | Status: AC
  Administered 2022-07-12: 1 mg via INTRAVENOUS
  Filled 2022-07-12: qty 1

## 2022-07-12 MED ORDER — HYDROCODONE-ACETAMINOPHEN 5-325 MG PO TABS: 1.0000 | ORAL_TABLET | ORAL | 0 refills | Status: DC | PRN

## 2022-07-12 MED ORDER — APIXABAN 5 MG PO TABS: 5.0000 mg | ORAL_TABLET | Freq: Two times a day (BID) | ORAL | Status: DC

## 2022-07-12 MED ORDER — APIXABAN 5 MG PO TABS
10.0000 mg | ORAL_TABLET | Freq: Two times a day (BID) | ORAL | Status: DC
  Administered 2022-07-12: 10 mg via ORAL
  Filled 2022-07-12: qty 2

## 2022-07-12 MED ORDER — APIXABAN (ELIQUIS) EDUCATION KIT FOR DVT/PE PATIENTS: PACK | Freq: Once | Status: DC

## 2022-07-12 MED ORDER — APIXABAN 5 MG PO TABS: ORAL_TABLET | ORAL | 0 refills | Status: DC

## 2022-07-12 MED ORDER — HYDRALAZINE HCL 20 MG/ML IJ SOLN
10.0000 mg | Freq: Once | INTRAMUSCULAR | Status: AC
  Administered 2022-07-12: 10 mg via INTRAVENOUS
  Filled 2022-07-12: qty 1

## 2022-07-12 NOTE — ED Provider Notes (Addendum)
[facility] EMERGENCY DEPARTMENT Provider Note   CSN: [id number] Arrival date & time: 07/12/22  0818     History  Chief Complaint  Patient presents with   Chest Pain    Luis Harrington is a 56 y.o. male.  Pt is a 56 yo male with a pmhx significant for HTN, HLD, and gout.  Pt said he's had left sided chest pain that suddenly started yesterday.  Pain is actually more in the back. Pt said pain is worse with movement.  He is sure there is something wrong with his lungs.  Bp is elevated and pt said he did take his am bp meds.  However, he drank a lot last night which he thinks has elevated his bp.  Pt denies any sob.         Home Medications Prior to Admission medications   Medication Sig Start Date End Date Taking? Authorizing Provider  apixaban (ELIQUIS) 5 MG TABS tablet Take 2 tablets (10mg ) twice daily for 7 days, then 1 tablet (5mg ) twice daily 07/12/22  Yes Isla Pence, MD  HYDROcodone-acetaminophen (NORCO/VICODIN) 5-325 MG tablet Take 1 tablet by mouth every 4 (four) hours as needed. 07/12/22  Yes Isla Pence, MD  allopurinol (ZYLOPRIM) 100 MG tablet TAKE 1 TABLET(100 MG) BY MOUTH DAILY. START AFTER FLARE RESOLVES 03/06/21   Mayers, Cari S, PA-C  atorvastatin (LIPITOR) 20 MG tablet TAKE 1 TABLET(20 MG) BY MOUTH DAILY 03/06/21   Mayers, Cari S, PA-C  hydrochlorothiazide (HYDRODIURIL) 25 MG tablet Take 1 tablet (25 mg total) by mouth daily. 11/26/21 02/24/22  Camillia Herter, NP  lisinopril (ZESTRIL) 10 MG tablet Take 1 tablet (10 mg total) by mouth daily. 11/26/21 02/24/22  Camillia Herter, NP  colchicine 0.6 MG tablet Take 2 tablets at first sign of flare, 1 tablet 1 hour later on day 1; then 1-2 tablets daily until flare resolves 11/03/20 11/05/20  Wieters, [employer] C, PA-C      Allergies    Shrimp [shellfish allergy]    Review of Systems   Review of Systems  Cardiovascular:  Positive for chest pain.  Musculoskeletal:  Positive for back pain.  All other systems  reviewed and are negative.   Physical Exam Updated Vital Signs BP (!) 217/135   Pulse 85   Temp 99.1 F (37.3 C) (Oral)   Resp 14   SpO2 98%  Physical Exam Vitals and nursing note reviewed.  Constitutional:      Appearance: He is well-developed.  HENT:     Head: Normocephalic and atraumatic.  Eyes:     Extraocular Movements: Extraocular movements intact.     Pupils: Pupils are equal, round, and reactive to light.  Cardiovascular:     Rate and Rhythm: Normal rate and regular rhythm.     Heart sounds: Normal heart sounds.  Pulmonary:     Effort: Pulmonary effort is normal.     Breath sounds: Normal breath sounds.  Abdominal:     General: Bowel sounds are normal.     Palpations: Abdomen is soft.  Musculoskeletal:        General: Normal range of motion.       Arms:     Cervical back: Normal range of motion and neck supple.  Skin:    General: Skin is warm.     Capillary Refill: Capillary refill takes less than 2 seconds.  Neurological:     General: No focal deficit present.     Mental Status: He is alert  and oriented to person, place, and time.  Psychiatric:        Mood and Affect: Mood normal.        Behavior: Behavior normal.     ED Results / Procedures / Treatments   Labs (all labs ordered are listed, but only abnormal results are displayed) Labs Reviewed  BASIC METABOLIC PANEL - Abnormal; Notable for the following components:      Result Value   Glucose, Bld 137 (*)    Creatinine, Ser 1.61 (*)    GFR, Estimated 50 (*)    All other components within normal limits  CBC - Abnormal; Notable for the following components:   WBC 16.1 (*)    RDW 15.9 (*)    All other components within normal limits  URINALYSIS, ROUTINE W REFLEX MICROSCOPIC - Abnormal; Notable for the following components:   Hgb urine dipstick SMALL (*)    Bacteria, UA RARE (*)    All other components within normal limits  HEPATIC FUNCTION PANEL - Abnormal; Notable for the following components:    Total Bilirubin 1.7 (*)    Indirect Bilirubin 1.5 (*)    All other components within normal limits  TROPONIN I (HIGH SENSITIVITY) - Abnormal; Notable for the following components:   Troponin I (High Sensitivity) 21 (*)    All other components within normal limits  RESP PANEL BY RT-PCR (FLU A&B, COVID) ARPGX2  LIPASE, BLOOD  TROPONIN I (HIGH SENSITIVITY)    EKG EKG Interpretation  Date/Time:  Sunday July 12 2022 08:38:48 EDT Ventricular Rate:  91 PR Interval:  158 QRS Duration: 104 QT Interval:  366 QTC Calculation: 450 R Axis:   -51 Text Interpretation: Normal sinus rhythm Possible Left atrial enlargement Incomplete right bundle branch block Left anterior fascicular block Left ventricular hypertrophy ( R in aVL , Cornell product ) Cannot rule out Septal infarct , age undetermined Abnormal ECG When compared with ECG of 08-Apr-2003 23:02, PREVIOUS ECG IS PRESENT No significant change since last tracing Confirmed by Isla Pence [phone]) on 07/12/2022 11:10:27 AM  Radiology CT Angio Chest PE W and/or Wo Contrast  Result Date: 07/12/2022 CLINICAL DATA:  PE suspected EXAM: CT ANGIOGRAPHY CHEST WITH CONTRAST TECHNIQUE: Multidetector CT imaging of the chest was performed using the standard protocol during bolus administration of intravenous contrast. Multiplanar CT image reconstructions and MIPs were obtained to evaluate the vascular anatomy. RADIATION DOSE REDUCTION: This exam was performed according to the departmental dose-optimization program which includes automated exposure control, adjustment of the mA and/or kV according to patient size and/or use of iterative reconstruction technique. CONTRAST:  40mL OMNIPAQUE IOHEXOL 350 MG/ML SOLN COMPARISON:  None Available. FINDINGS: Cardiovascular: Satisfactory opacification of the pulmonary arteries to the segmental level. Segmental to subsegmental embolus isolated to the apical segment of the right upper lobe (series 6, image 71). Normal  heart size. No pericardial effusion. Scattered aortic atherosclerosis. Mediastinum/Nodes: No enlarged mediastinal, hilar, or axillary lymph nodes. Thyroid gland, trachea, and esophagus demonstrate no significant findings. Lungs/Pleura: Lungs are clear. No pleural effusion or pneumothorax. Upper Abdomen: No acute abnormality. Macroscopic fat containing, definitively benign bilateral adrenal adenomata, for which no further follow-up or characterization is required. Musculoskeletal: No chest wall abnormality. No acute osseous findings. Review of the MIP images confirms the above findings. IMPRESSION: Positive examination for pulmonary embolism with segmental to subsegmental embolus isolated to the apical segment of the right upper lobe. These results were called by telephone at the time of interpretation on 07/12/2022 at 1:08 pm to  Dr. Isla Pence , who verbally acknowledged these results. Aortic Atherosclerosis (ICD10-I70.0). Electronically Signed   By: Delanna Ahmadi M.D.   On: 07/12/2022 13:12   DG Chest 2 View  Result Date: 07/12/2022 CLINICAL DATA:  Acute onset left-sided chest pain since yesterday. EXAM: CHEST - 2 VIEW COMPARISON:  None Available. FINDINGS: The heart size and mediastinal contours are within normal limits. Both lungs are clear. The visualized skeletal structures are unremarkable. IMPRESSION: No active cardiopulmonary disease. Electronically Signed   By: Titus Dubin M.D.   On: 07/12/2022 09:39    Procedures Procedures    Medications Ordered in ED Medications  apixaban (ELIQUIS) tablet 10 mg (has no administration in time range)    Followed by  apixaban (ELIQUIS) tablet 5 mg (has no administration in time range)  morphine (PF) 4 MG/ML injection 4 mg (4 mg Intravenous Given 07/12/22 1229)  ondansetron (ZOFRAN) injection 4 mg (4 mg Intravenous Given 07/12/22 1227)  sodium chloride 0.9 % bolus 1,000 mL (1,000 mLs Intravenous New Bag/Given 07/12/22 1227)  iohexol (OMNIPAQUE) 350  MG/ML injection 80 mL (80 mLs Intravenous Contrast Given 07/12/22 1259)  hydrALAZINE (APRESOLINE) injection 10 mg (10 mg Intravenous Given 07/12/22 1344)  HYDROmorphone (DILAUDID) injection 1 mg (1 mg Intravenous Given 07/12/22 1345)    ED Course/ Medical Decision Making/ A&P                           Medical Decision Making Amount and/or Complexity of Data Reviewed Labs: ordered. Radiology: ordered.  Risk Prescription drug management.   This patient presents to the ED for concern of cp, this involves an extensive number of treatment options, and is a complaint that carries with it a high risk of complications and morbidity.  The differential diagnosis includes cad, pulm, gi, msk   Co morbidities that complicate the patient evaluation  HTN, HLD, and gout   Additional history obtained:  Additional history obtained from epic chart review External records from outside source obtained and reviewed including wife   Lab Tests:  I Ordered, and personally interpreted labs.  The pertinent results include:  cbc with wbc elevated at 16; trop 21; bmp with cr 1.61 (chronic); covid neg; trop 21 and 17   Imaging Studies ordered:  I ordered imaging studies including cxr and ct chest  I independently visualized and interpreted imaging which showed  CXR: IMPRESSION:  No active cardiopulmonary disease.   I agree with the radiologist interpretation   Cardiac Monitoring:  The patient was maintained on a cardiac monitor.  I personally viewed and interpreted the cardiac monitored which showed an underlying rhythm of: nsr   Medicines ordered and prescription drug management:  I ordered medication including morphine  for pain  Reevaluation of the patient after these medicines showed that the patient improved I have reviewed the patients home medicines and have made adjustments as needed   Test Considered:  ct   Critical Interventions:  Pain control     Problem List / ED  Course:  Chest pain:  cardiac eval unremarkable.  CT chest + for PE.  It is segmental and is not causing any oxygen requirement.  Initial trop 21, but 2nd was 17.  Doubt cor pulmonale.  Pt is not in any distress.  He is d/w pharm who talked to pt about eliquis.  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  So, this PE  is likely provoked from a long drive.  Pt's pain is better. HTN:  pt said he took his meds today. BP still elevated despite hydralazine.  However, without dissection or headache, I think it can be managed as an outpatient.  Pt told to eat a low salt diet and take his meds as directed.  He needs to f/u with pcp.  Return if worse.    Reevaluation:  After the interventions noted above, I reevaluated the patient and found that they have :improved   Social Determinants of Health:  Lives at home   Dispostion:  After consideration of the diagnostic results and the patients response to treatment, I feel that the patent would benefit from discharge with outpatient f/u.          Final Clinical Impression(s) / ED Diagnoses Final diagnoses:  Hypertension, unspecified type  Acute pulmonary embolism without acute cor pulmonale, unspecified pulmonary embolism type ([place])  Stage 3a chronic kidney disease ([place])    Rx / DC Orders ED Discharge Orders          Ordered    apixaban (ELIQUIS) 5 MG TABS tablet        07/12/22 1412    HYDROcodone-acetaminophen (NORCO/VICODIN) 5-325 MG tablet  Every 4 hours PRN        07/12/22 1412              Isla Pence, MD 07/12/22 1417    Isla Pence, MD 07/12/22 1418

## 2022-07-12 NOTE — ED Triage Notes (Addendum)
Patient here with complaint of left sided chest pain that started suddenly yesterday morning that was initially mild and only occurred when he moved a certain way. Pain become more intense over yesterday and this morning. Patient states pain almost completely resolves when sitting still, but if he rotates his torso or lifts his arms the pain starts. Patient denies dizziness, denies nausea. Patient is alert, oriented, and in no apparent distress at this time.  History of hypertension, patient takes lisinopril and has not missed any doses.

## 2022-07-12 NOTE — ED Notes (Signed)
Pt verbalizes understanding of discharge instructions. Opportunity for questions and answers were provided. Pt discharged from the ED.   ?

## 2022-07-12 NOTE — Discharge Instructions (Addendum)
Information on my medicine - ELIQUIS® (apixaban) ° °Why was Eliquis® prescribed for you? °Eliquis® was prescribed for you to reduce the risk of forming blood clots that can cause a stroke if you have a medical condition called atrial fibrillation (a type of irregular heartbeat) OR to reduce the risk of a blood clots forming after orthopedic surgery. ° °What do You need to know about Eliquis® ? °Take your Eliquis® TWICE DAILY - one tablet in the morning and one tablet in the evening with or without food.  It would be best to take the doses about the same time each day. ° °If you have difficulty swallowing the tablet whole please discuss with your pharmacist how to take the medication safely. ° °Take Eliquis® exactly as prescribed by your doctor and DO NOT stop taking Eliquis® without talking to the doctor who prescribed the medication.  Stopping may increase your risk of developing a new clot or stroke.  Refill your prescription before you run out. ° °After discharge, you should have regular check-up appointments with your healthcare provider that is prescribing your Eliquis®.  In the future your dose may need to be changed if your kidney function or weight changes by a significant amount or as you get older. ° °What do you do if you miss a dose? °If you miss a dose, take it as soon as you remember on the same day and resume taking twice daily.  Do not take more than one dose of ELIQUIS at the same time. ° °Important Safety Information °A possible side effect of Eliquis® is bleeding. You should call your healthcare provider right away if you experience any of the following: °Bleeding from an injury or your nose that does not stop. °Unusual colored urine (red or dark brown) or unusual colored stools (red or black). °Unusual bruising for unknown reasons. °A serious fall or if you hit your head (even if there is no bleeding). ° °Some medicines may interact with Eliquis® and might increase your risk of bleeding or  clotting while on Eliquis®. To help avoid this, consult your healthcare provider or pharmacist prior to using any new prescription or non-prescription medications, including herbals, vitamins, non-steroidal anti-inflammatory drugs (NSAIDs) and supplements. ° °This website has more information on Eliquis® (apixaban): http://www.eliquis.com/eliquis/home °  °

## 2022-07-12 NOTE — Progress Notes (Signed)
ANTICOAGULATION CONSULT NOTE - Initial Consult  Pharmacy Consult for Eliquis Indication: pulmonary embolus  Allergies  Allergen Reactions   Shrimp [Shellfish Allergy] Itching    Patient Measurements:    Vital Signs: Temp: 99.1 F (37.3 C) (09/17 1233) Temp Source: Oral (09/17 1233) BP: 217/135 (09/17 1345) Pulse Rate: 85 (09/17 1345)  Labs: Recent Labs    07/12/22 0850 07/12/22 1126  HGB 14.3  --   HCT 40.8  --   PLT 256  --   CREATININE 1.61*  --   TROPONINIHS 21* 17    CrCl cannot be calculated (Unknown ideal weight.).   Medical History: Past Medical History:  Diagnosis Date   Gout    Hypertension     Assessment: 39 YOM presenting with CP, CT angio of chest shows PE, he is not on anticoagulation PTA  Goal of Therapy:  Monitor platelets by anticoagulation protocol: Yes   Plan:  Eliquis 10mg  PO BID x 7d, then 5mg  PO BID thereafter Monitor s/s bleeding  Bertis Ruddy, PharmD Clinical Pharmacist ED Pharmacist Phone # [phone] 07/12/2022 2:05 PM

## 2022-07-13 ENCOUNTER — Ambulatory Visit: Payer: Self-pay

## 2022-07-13 NOTE — Telephone Encounter (Signed)
  Chief Complaint: ED Fu Symptoms: chest discomfort r/t PE, weakness, easily fatigued Frequency: yesterday Pertinent Negatives: NA Disposition: [] ED /[] Urgent Care (no appt availability in office) / [x] Appointment(In office/virtual)/ []  [facility]/ [] Home Care/ [] Refused Recommended Disposition /[] [facility]/ []  Follow-up with PCP Additional Notes: pt was calling to schedule fu appt d/t being scheduled to go back to work today and still having discomfort from PE in chest. Pt started eliquis this morning. Scheduled appt for 07/15/22 at 1400 with Levada Dy, [place]. Advised pt to call back if symptoms get any worse. Verbalized understanding.   Reason for Disposition  [1] Recent medical visit within 24 hours AND [2] NEW symptom AND [3] that could be serious  Answer Assessment - Initial Assessment Questions 1. MAIN CONCERN OR SYMPTOM:  "What is your main concern right now?" "What question do you have?" "What's the main symptom you're worried about?" (e.g., breathing difficulty, cough, fever. pain)     PE chest, still having chest discomfort 2. ONSET: "When did the  sx  start?"     07/12/22 3. BETTER-SAME-WORSE: "Are you getting better, staying the same, or getting worse compared to how you felt at your last visit to the doctor (most recent medical visit)?"     Getting better  4. VISIT DATE: "When were you seen?" (Date)     07/12/22  6. VISIT DIAGNOSIS:  "What was the main symptom or problem that you were seen for?" "Were you given a diagnosis?"      PE chest 7. VISIT MEDICINES: "Did the doctor order any new medicines for you to use?" If Yes, ask: "Have you filled the prescription and started taking the medicine?"      eliquis 8. NEXT APPOINTMENT: "Have you scheduled a follow-up appointment with your doctor?"     Calling now 27. PAIN: "Is there any pain?" If Yes, ask: "How bad is it?"  (Scale 0-10; or mild, moderate, severe)    - NONE (0): no pain    - MILD (1-3): doesn't  interfere with normal activities     - MODERATE (4-7): interferes with normal activities or awakens from sleep     - SEVERE (8-10): excruciating pain, unable to do any normal activities     5/10  Protocols used: Recent Medical Visit for Illness Follow-up Call-A-AH

## 2022-07-15 ENCOUNTER — Other Ambulatory Visit: Payer: Self-pay

## 2022-07-15 ENCOUNTER — Ambulatory Visit (INDEPENDENT_AMBULATORY_CARE_PROVIDER_SITE_OTHER): Payer: BC Managed Care – PPO | Admitting: Physician Assistant

## 2022-07-15 ENCOUNTER — Encounter: Payer: Self-pay | Admitting: Physician Assistant

## 2022-07-15 VITALS — BP 137/128 | HR 88 | Temp 98.1°F | Resp 16 | Wt 250.0 lb

## 2022-07-15 DIAGNOSIS — I10 Essential (primary) hypertension: Secondary | ICD-10-CM

## 2022-07-15 DIAGNOSIS — Z09 Encounter for follow-up examination after completed treatment for conditions other than malignant neoplasm: Secondary | ICD-10-CM | POA: Diagnosis not present

## 2022-07-15 DIAGNOSIS — I2699 Other pulmonary embolism without acute cor pulmonale: Secondary | ICD-10-CM

## 2022-07-15 MED ORDER — ACETAMINOPHEN-CODEINE 300-30 MG PO TABS: 1.0000 | ORAL_TABLET | ORAL | 0 refills | Status: DC | PRN

## 2022-07-15 MED ORDER — LOSARTAN POTASSIUM 50 MG PO TABS: 50.0000 mg | ORAL_TABLET | Freq: Every day | ORAL | 0 refills | Status: DC

## 2022-07-15 MED ORDER — APIXABAN 5 MG PO TABS
5.0000 mg | ORAL_TABLET | Freq: Two times a day (BID) | ORAL | 2 refills | Status: DC
  Filled 2022-07-15 – 2022-08-06 (×2): qty 60, 30d supply, fill #0
  Filled 2022-09-02 – 2022-09-14 (×2): qty 60, 30d supply, fill #1
  Filled 2022-10-27: qty 60, 30d supply, fill #2

## 2022-07-15 NOTE — Progress Notes (Signed)
Patient ID: Luis Harrington, male   DOB: Aug 03, 1966, 56 y.o.   MRN: [id number]    Luis Harrington, is a 56 y.o. male  WUX:[id number]  [id number]  DOB - Aug 21, 1966  Chief Complaint  Patient presents with   Follow-up    HFU       Subjective:   Luis Harrington is a 56 y.o. male here today for a follow up visit after being seen at the ED 07/12/2022.  He feels like he can go back to work Monday.  He lifts/moves mattresses.  He is feeling much better but still having some pain.  Wants [facility] HC.  He is not taking Lisinopril or his HCTZ.  He says they cause constipation and ED.  He is willing to try Losartan bc he says it might help ED.  He denies CP/SOB.  He knows he needs to make lifestyle changes-he admits to smoking daily and drinking alcohol regularly.  He has a BP cuff at home   After ED visit for CP 07/12/2022.  From ED A/P: Problem List / ED Course:   Chest pain:  cardiac eval unremarkable.  CT chest + for PE.  It is segmental and is not causing any oxygen requirement.  Initial trop 21, but 2nd was 17.  Doubt cor pulmonale.  Pt is not in any distress.  He is d/w pharm who talked to pt about eliquis.  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  So, this PE is likely provoked from a long drive.  Pt's pain is better. HTN:  pt said he took his meds today. BP still elevated despite hydralazine.  However, without dissection or headache, I think it can be managed as an outpatient.  Pt told to eat a low salt diet and take his meds as directed.  He needs to f/u with pcp.  Return if worse.   No problems updated.  ALLERGIES: Allergies  Allergen Reactions   Shrimp [Shellfish Allergy] Itching    PAST MEDICAL HISTORY: Past Medical History:  Diagnosis Date   Gout    Hypertension     MEDICATIONS AT HOME: Prior to Admission medications   Medication Sig Start Date End Date Taking? Authorizing Provider  acetaminophen-codeine (TYLENOL #3) 300-30 MG  tablet Take 1 tablet by mouth every 4 (four) hours as needed for moderate pain. 07/15/22  Yes Keyanah Kozicki, Dionne Bucy, PA-C  allopurinol (ZYLOPRIM) 100 MG tablet TAKE 1 TABLET(100 MG) BY MOUTH DAILY. START AFTER FLARE RESOLVES 03/06/21  Yes Mayers, Cari S, PA-C  apixaban (ELIQUIS) 5 MG TABS tablet Take 2 tablets (10mg ) twice daily for 7 days, then 1 tablet (5mg ) twice daily 07/12/22  Yes Isla Pence, MD  apixaban (ELIQUIS) 5 MG TABS tablet Take 1 tablet (5 mg total) by mouth 2 (two) times daily. 07/15/22  Yes Hadassa Cermak M, PA-C  atorvastatin (LIPITOR) 20 MG tablet TAKE 1 TABLET(20 MG) BY MOUTH DAILY 03/06/21  Yes Mayers, Cari S, PA-C  losartan (COZAAR) 50 MG tablet Take 1 tablet (50 mg total) by mouth daily. 07/15/22  Yes Freeman Caldron M, PA-C  colchicine 0.6 MG tablet Take 2 tablets at first sign of flare, 1 tablet 1 hour later on day 1; then 1-2 tablets daily until flare resolves 11/03/20 11/05/20  Wieters, Hallie C, PA-C    ROS: Neg HEENT Neg resp Neg cardiac Neg GI Neg GU Neg MS Neg psych Neg neuro  Objective:   Vitals:   07/15/22 1418  07/15/22 1424  BP: (!) 220/140 (!) 137/128  Pulse: 88   Resp: 16   Temp: 98.1 F (36.7 C)   TempSrc: Oral   SpO2: 98%   Weight: 250 lb (113.4 kg)    Exam General appearance : Awake, alert, not in any distress. Speech Clear. Not toxic looking HEENT: Atraumatic and Normocephalic. Neck: Supple, no JVD. No cervical lymphadenopathy.  Chest: Good air entry bilaterally, CTAB.  No rales/rhonchi/wheezing CVS: S1 S2 regular, no murmurs.  Extremities: B/L Lower Ext shows no edema, both legs are warm to touch.  No assymmetry Neurology: Awake alert, and oriented X 3, CN II-XII intact, Non focal Skin: No Rash Poor J/I with medical conditions and poor lifestyle  Data Review Lab Results  Component Value Date   HGBA1C 5.7 04/06/2018    Assessment & Plan   1. Essential hypertension With hypertensive urgency-compliance imperative.  Discussed at length.   Discussed risks associated with BP as high as his including stroke, etc as well as mortality.  He is not willing to take lisinopril or HCT anymore.  He feels it makes him constipated and have ED.  He wants to try losartan and is willing to take it bc it can improve ED  - losartan (COZAAR) 50 MG tablet; Take 1 tablet (50 mg total) by mouth daily.  Dispense: 90 tablet; Refill: 0 Check blood pressure daily and record and bring to next visit.    2. Other acute pulmonary embolism without acute cor pulmonale (HCC) - apixaban (ELIQUIS) 5 MG TABS tablet; Take 1 tablet (5 mg total) by mouth 2 (two) times daily.  Dispense: 60 tablet; Refill: 2 - acetaminophen-codeine (TYLENOL #3) 300-30 MG tablet; Take 1 tablet by mouth every 4 (four) hours as needed for moderate pain.  Dispense: 10 tablet; Refill: 0  3.  Hospital f/up  Work on eliminating cigs and alcohol.    Return in about 1 month (around 08/14/2022) for PCP for htn f/up.  The patient was given clear instructions to go to ER or return to medical center if symptoms don't improve, worsen or new problems develop. The patient verbalized understanding. The patient was told to call to get lab results if they haven't heard anything in the next week.      Georgian Co, PA-C [facility] [place], [place] [phone]   07/15/2022, 2:49 PM

## 2022-07-15 NOTE — Progress Notes (Signed)
Patient was here for hfu visit. Patient said that he feels better since since taking his medication. Patient said he was dx with blood clot

## 2022-07-15 NOTE — Patient Instructions (Signed)
Check blood pressure daily and record and bring to next visit.  Hypertension, Adult Hypertension is another name for high blood pressure. High blood pressure forces your heart to work harder to pump blood. This can cause problems over time. There are two numbers in a blood pressure reading. There is a top number (systolic) over a bottom number (diastolic). It is best to have a blood pressure that is below 120/80. What are the causes? The cause of this condition is not known. Some other conditions can lead to high blood pressure. What increases the risk? Some lifestyle factors can make you more likely to develop high blood pressure: Smoking. Not getting enough exercise or physical activity. Being overweight. Having too much fat, sugar, calories, or salt (sodium) in your diet. Drinking too much alcohol. Other risk factors include: Having any of these conditions: Heart disease. Diabetes. High cholesterol. Kidney disease. Obstructive sleep apnea. Having a family history of high blood pressure and high cholesterol. Age. The risk increases with age. Stress. What are the signs or symptoms? High blood pressure may not cause symptoms. Very high blood pressure (hypertensive crisis) may cause: Headache. Fast or uneven heartbeats (palpitations). Shortness of breath. Nosebleed. Vomiting or feeling like you may vomit (nauseous). Changes in how you see. Very bad chest pain. Feeling dizzy. Seizures. How is this treated? This condition is treated by making healthy lifestyle changes, such as: Eating healthy foods. Exercising more. Drinking less alcohol. Your doctor may prescribe medicine if lifestyle changes do not help enough and if: Your top number is above 130. Your bottom number is above 80. Your personal target blood pressure may vary. Follow these instructions at home: Eating and drinking  If told, follow the DASH eating plan. To follow this plan: Fill one half of your plate at each  meal with fruits and vegetables. Fill one fourth of your plate at each meal with whole grains. Whole grains include whole-wheat pasta, brown rice, and whole-grain bread. Eat or drink low-fat dairy products, such as skim milk or low-fat yogurt. Fill one fourth of your plate at each meal with low-fat (lean) proteins. Low-fat proteins include fish, chicken without skin, eggs, beans, and tofu. Avoid fatty meat, cured and processed meat, or chicken with skin. Avoid pre-made or processed food. Limit the amount of salt in your diet to less than 1,500 mg each day. Do not drink alcohol if: Your doctor tells you not to drink. You are pregnant, may be pregnant, or are planning to become pregnant. If you drink alcohol: Limit how much you have to: 0-1 drink a day for women. 0-2 drinks a day for men. Know how much alcohol is in your drink. In the [place] one drink equals one 12 oz bottle of beer (355 mL), one 5 oz glass of wine (148 mL), or one 1 oz glass of hard liquor (44 mL). Lifestyle  Work with your doctor to stay at a healthy weight or to lose weight. Ask your doctor what the best weight is for you. Get at least 30 minutes of exercise that causes your heart to beat faster (aerobic exercise) most days of the week. This may include walking, swimming, or biking. Get at least 30 minutes of exercise that strengthens your muscles (resistance exercise) at least 3 days a week. This may include lifting weights or doing Pilates. Do not smoke or use any products that contain nicotine or tobacco. If you need help quitting, ask your doctor. Check your blood pressure at home as told by  your doctor. Keep all follow-up visits. Medicines Take over-the-counter and prescription medicines only as told by your doctor. Follow directions carefully. Do not skip doses of blood pressure medicine. The medicine does not work as well if you skip doses. Skipping doses also puts you at risk for problems. Ask your doctor about  side effects or reactions to medicines that you should watch for. Contact a doctor if: You think you are having a reaction to the medicine you are taking. You have headaches that keep coming back. You feel dizzy. You have swelling in your ankles. You have trouble with your vision. Get help right away if: You get a very bad headache. You start to feel mixed up (confused). You feel weak or numb. You feel faint. You have very bad pain in your: Chest. Belly (abdomen). You vomit more than once. You have trouble breathing. These symptoms may be an emergency. Get help right away. Call 911. Do not wait to see if the symptoms will go away. Do not drive yourself to the hospital. Summary Hypertension is another name for high blood pressure. High blood pressure forces your heart to work harder to pump blood. For most people, a normal blood pressure is less than 120/80. Making healthy choices can help lower blood pressure. If your blood pressure does not get lower with healthy choices, you may need to take medicine. This information is not intended to replace advice given to you by your health care provider. Make sure you discuss any questions you have with your health care provider. Document Revised: 07/31/2021 Document Reviewed: 07/31/2021 Elsevier Patient Education  [place].

## 2022-07-26 ENCOUNTER — Ambulatory Visit
Admission: EM | Admit: 2022-07-26 | Discharge: 2022-07-26 | Disposition: A | Discharge status: Home / Self Care | Payer: BC Managed Care – PPO | Attending: Physician Assistant | Admitting: Physician Assistant

## 2022-07-26 DIAGNOSIS — I10 Essential (primary) hypertension: Secondary | ICD-10-CM

## 2022-07-26 DIAGNOSIS — I160 Hypertensive urgency: Secondary | ICD-10-CM | POA: Diagnosis not present

## 2022-07-26 DIAGNOSIS — M109 Gout, unspecified: Secondary | ICD-10-CM

## 2022-07-26 MED ORDER — LOSARTAN POTASSIUM 50 MG PO TABS: 100.0000 mg | ORAL_TABLET | Freq: Every day | ORAL | 0 refills | Status: DC

## 2022-07-26 MED ORDER — COLCHICINE 0.6 MG PO TABS: 0.6000 mg | ORAL_TABLET | Freq: Every day | ORAL | 0 refills | Status: DC

## 2022-07-26 NOTE — ED Triage Notes (Signed)
Pt presents with chronic gout in both feet.

## 2022-07-26 NOTE — ED Provider Notes (Signed)
[facility]    CSN: [id number] Arrival date & time: 07/26/22  1324      History   Chief Complaint Chief Complaint  Patient presents with   Gout     HPI Luis Harrington is a 56 y.o. male.   Patient here today for evaluation of bilateral ankle pain that he reports is typical for a gout flare. He reports that right is worse than left. He has significant pain with weight bearing or movement of his right ankle. He does not report treatment for same. He was on allopurinol in the past but has not been taking recently.  He does not report any recent injury.   On intake it is also noted he has significantly elevated BP. Chart review reveals this is not abnormal for patient. He denies any chest pain, shortness of breath, headaches, numbness or tingling. He was recently started on losartan 50 mg daily and states he has been monitoring BP at home and has not seen a large improvement. Chart review reveals that he is not willing to take lisinopril and HCTZ due to ED side effects.   The history is provided by the patient.    Past Medical History:  Diagnosis Date   Gout    Hypertension     Patient Active Problem List   Diagnosis Date Noted   History of gout 12/26/2020   Hyperglycemia 04/07/2018   Dyslipidemia 04/07/2018   Gout 04/06/2018   Essential hypertension 04/06/2018    Past Surgical History:  Procedure Laterality Date   TENDON REPAIR     Finger       Home Medications    Prior to Admission medications   Medication Sig Start Date End Date Taking? Authorizing Provider  colchicine 0.6 MG tablet Take 1 tablet (0.6 mg total) by mouth daily for 7 days. 07/26/22 08/02/22 Yes Francene Finders, PA-C  acetaminophen-codeine (TYLENOL #3) 300-30 MG tablet Take 1 tablet by mouth every 4 (four) hours as needed for moderate pain. 07/15/22   Argentina Donovan, PA-C  allopurinol (ZYLOPRIM) 100 MG tablet TAKE 1 TABLET(100 MG) BY MOUTH DAILY. START AFTER FLARE RESOLVES 03/06/21    Mayers, Cari S, PA-C  apixaban (ELIQUIS) 5 MG TABS tablet Take 2 tablets (10mg ) twice daily for 7 days, then 1 tablet (5mg ) twice daily 07/12/22   Isla Pence, MD  apixaban (ELIQUIS) 5 MG TABS tablet Take 1 tablet (5 mg total) by mouth 2 (two) times daily. 07/15/22   Argentina Donovan, PA-C  atorvastatin (LIPITOR) 20 MG tablet TAKE 1 TABLET(20 MG) BY MOUTH DAILY 03/06/21   Mayers, Cari S, PA-C  losartan (COZAAR) 50 MG tablet Take 2 tablets (100 mg total) by mouth daily. 07/26/22   Francene Finders, PA-C    Family History Family History  Problem Relation Age of Onset   Diabetes Mother    Hypertension Mother    Diabetes Father    Hypertension Father     Social History Social History   Tobacco Use   Smoking status: Former   Smokeless tobacco: Never  Scientific laboratory technician Use: Never used  Substance Use Topics   Alcohol use: Yes   Drug use: No     Allergies   Shrimp [shellfish allergy]   Review of Systems Review of Systems  Constitutional:  Negative for chills and fever.  Eyes:  Negative for discharge and redness.  Respiratory:  Negative for chest tightness and shortness of breath.   Cardiovascular:  Negative for chest  pain and leg swelling.  Musculoskeletal:  Positive for arthralgias and joint swelling.  Neurological:  Negative for numbness and headaches.     Physical Exam Triage Vital Signs ED Triage Vitals  Enc Vitals Group     BP      Pulse      Resp      Temp      Temp src      SpO2      Weight      Height      Head Circumference      Peak Flow      Pain Score      Pain Loc      Pain Edu?      Excl. in [place]?    No data found.  Updated Vital Signs BP (!) 200/137 (BP Location: Right Arm)   Pulse 96   Temp 98.3 F (36.8 C) (Oral)   Resp 17   SpO2 98%      Physical Exam Vitals and nursing note reviewed.  Constitutional:      General: He is not in acute distress.    Appearance: Normal appearance. He is not ill-appearing.  HENT:     Head:  Normocephalic and atraumatic.  Eyes:     Conjunctiva/sclera: Conjunctivae normal.  Cardiovascular:     Rate and Rhythm: Normal rate.  Pulmonary:     Effort: Pulmonary effort is normal. No respiratory distress.  Musculoskeletal:     Comments: Mild swelling appreciated to right ankle diffusely, Decreased ROM of right ankle in all direction due to pain. Patient using crutches to ambulate  Neurological:     Mental Status: He is alert.  Psychiatric:        Mood and Affect: Mood normal.        Behavior: Behavior normal.        Thought Content: Thought content normal.      UC Treatments / Results  Labs (all labs ordered are listed, but only abnormal results are displayed) Labs Reviewed - No data to display  EKG   Radiology No results found.  Procedures Procedures (including critical care time)  Medications Ordered in UC Medications - No data to display  Initial Impression / Assessment and Plan / UC Course  I have reviewed the triage vital signs and the nursing notes.  Pertinent labs & imaging results that were available during my care of the patient were reviewed by me and considered in my medical decision making (see chart for details).    Will increase losartan dose to 100 mg daily and recommended he continue to monitor BP at home and log as recommended. He is to follow up with PCP in about 15 days. Encouraged evaluation in the ED immediately with any chest pain, shortness of breath, headache, etc.  Patient expresses understanding.   Colchicine prescribed for suspected gout flare. Recommended he not restart allopurinol at this time- recommended he discuss at follow up with PCP in 2 weeks if symptoms are clear. Encouraged sooner follow up with any further concerns.   Final Clinical Impressions(s) / UC Diagnoses   Final diagnoses:  Hypertensive urgency  Acute gout of right ankle, unspecified cause   Discharge Instructions   None    ED Prescriptions     Medication  Sig Dispense Auth. Provider   colchicine 0.6 MG tablet Take 1 tablet (0.6 mg total) by mouth daily for 7 days. 14 tablet Ewell Poe F, PA-C   losartan (COZAAR) 50 MG tablet Take  2 tablets (100 mg total) by mouth daily. 90 tablet Francene Finders, PA-C      PDMP not reviewed this encounter.   Francene Finders, PA-C 07/26/22 [id number]

## 2022-07-27 ENCOUNTER — Ambulatory Visit
Admission: EM | Admit: 2022-07-27 | Discharge: 2022-07-27 | Disposition: A | Discharge status: Home / Self Care | Payer: PRIVATE HEALTH INSURANCE | Attending: Physician Assistant | Admitting: Physician Assistant

## 2022-07-27 ENCOUNTER — Other Ambulatory Visit: Payer: Self-pay | Admitting: Family

## 2022-07-27 DIAGNOSIS — Z8739 Personal history of other diseases of the musculoskeletal system and connective tissue: Secondary | ICD-10-CM

## 2022-07-27 DIAGNOSIS — E790 Hyperuricemia without signs of inflammatory arthritis and tophaceous disease: Secondary | ICD-10-CM

## 2022-07-27 DIAGNOSIS — M109 Gout, unspecified: Secondary | ICD-10-CM

## 2022-07-27 MED ORDER — ALLOPURINOL 100 MG PO TABS: ORAL_TABLET | ORAL | 3 refills | Status: DC

## 2022-07-27 NOTE — ED Triage Notes (Signed)
Pt c/o gout flare not satisfied with colchicine states wants what he "knows will work and feel comfortable with."

## 2022-07-27 NOTE — ED Provider Notes (Signed)
[name]    CSN: [id number] Arrival date & time: 07/27/22  1418      History   Chief Complaint Chief Complaint  Patient presents with   Gout    HPI Luis Harrington is a 56 y.o. male.   56 year old male presents with gout.  Patient indicates that he has an acute flare of bilateral ankle gout with the right worse than the left.  He relates the flare started after eating pigs feet.  He indicates that he has swelling of both ankle joints, pain with walking such that it is causing him to use crutches to be able to walk.  He relates that he is limping quite a bit on the right ankle.  He does indicate that he was seen yesterday and given colchicine.  He indicates he has taken 6 tablets so far and it has given him diarrhea.  He has been advised to stop the cholestyramine and not take any more tablets.  He indicates he usually only takes Tylenol for pain relief and really does not want to take any prednisone.  He relates that he usually will take allopurinol and it works very well to stop his gouty flare and relieve his pain.  He has been advised that usually allopurinol is only meant to be taken after the acute gout episode resolves.  He was explained that allopurinol is usually used as a preventive medicine.  Patient does indicate that he has being followed by PCP and that he was started on losartan 50 mg twice daily a week ago in order to try to get better control of his blood pressure.  Patient denies fever, nausea or vomiting.     Past Medical History:  Diagnosis Date   Gout    Hypertension     Patient Active Problem List   Diagnosis Date Noted   History of gout 12/26/2020   Hyperglycemia 04/07/2018   Dyslipidemia 04/07/2018   Gout 04/06/2018   Essential hypertension 04/06/2018    Past Surgical History:  Procedure Laterality Date   TENDON REPAIR     Finger       Home Medications    Prior to Admission medications   Medication Sig Start Date End Date Taking?  Authorizing Provider  acetaminophen-codeine (TYLENOL #3) 300-30 MG tablet Take 1 tablet by mouth every 4 (four) hours as needed for moderate pain. 07/15/22   Argentina Donovan, PA-C  allopurinol (ZYLOPRIM) 100 MG tablet TAKE 1 TABLET(100 MG) BY MOUTH DAILY. START AFTER FLARE RESOLVES 07/27/22   Nyoka Lint, PA-C  apixaban (ELIQUIS) 5 MG TABS tablet Take 2 tablets (10mg ) twice daily for 7 days, then 1 tablet (5mg ) twice daily 07/12/22   Isla Pence, MD  apixaban (ELIQUIS) 5 MG TABS tablet Take 1 tablet (5 mg total) by mouth 2 (two) times daily. 07/15/22   Argentina Donovan, PA-C  atorvastatin (LIPITOR) 20 MG tablet TAKE 1 TABLET(20 MG) BY MOUTH DAILY 03/06/21   Mayers, Cari S, PA-C  colchicine 0.6 MG tablet Take 1 tablet (0.6 mg total) by mouth daily for 7 days. 07/26/22 08/02/22  Francene Finders, PA-C  losartan (COZAAR) 50 MG tablet Take 2 tablets (100 mg total) by mouth daily. 07/26/22   Francene Finders, PA-C    Family History Family History  Problem Relation Age of Onset   Diabetes Mother    Hypertension Mother    Diabetes Father    Hypertension Father     Social History Social History   Tobacco  Use   Smoking status: Former   Smokeless tobacco: Never  Vaping Use   Vaping Use: Never used  Substance Use Topics   Alcohol use: Yes   Drug use: No     Allergies   Shrimp [shellfish allergy]   Review of Systems Review of Systems  Musculoskeletal:  Positive for joint swelling (both ankles).     Physical Exam Triage Vital Signs ED Triage Vitals [07/27/22 1455]  Enc Vitals Group     BP (!) 238/143     Pulse Rate 92     Resp 16     Temp 98.2 F (36.8 C)     Temp Source Oral     SpO2 97 %     Weight      Height      Head Circumference      Peak Flow      Pain Score 8     Pain Loc      Pain Edu?      Excl. in [place]?    No data found.  Updated Vital Signs BP (!) 237/151   Pulse 92   Temp 98.2 F (36.8 C) (Oral)   Resp 16   SpO2 97%   Visual Acuity Right Eye  Distance:   Left Eye Distance:   Bilateral Distance:    Right Eye Near:   Left Eye Near:    Bilateral Near:     Physical Exam Constitutional:      Appearance: Normal appearance.  Skin:    Comments: Bilateral ankles: 2+ swelling right ankle as compared to 1+ swelling left ankle, limited range of motion present, stability intact.  Neurological:     Mental Status: He is alert.      UC Treatments / Results  Labs (all labs ordered are listed, but only abnormal results are displayed) Labs Reviewed - No data to display  EKG   Radiology No results found.  Procedures Procedures (including critical care time)  Medications Ordered in UC Medications - No data to display  Initial Impression / Assessment and Plan / UC Course  I have reviewed the triage vital signs and the nursing notes.  Pertinent labs & imaging results that were available during my care of the patient were reviewed by me and considered in my medical decision making (see chart for details).    Plan: 1.  The acute gouty arthritis flare will be treated with the following: A.  Advised patient to take Tylenol or ibuprofen for pain relief. B.  Advised patient to take the prednisone that he has at home, 30 mg 3-4 times a day to help reduce the acute inflammation. C.  Advised patient to follow-up with PCP or return to urgent care if symptoms fail to improve. Final Clinical Impressions(s) / UC Diagnoses   Final diagnoses:  Acute gout of ankle, unspecified cause, unspecified laterality     Discharge Instructions      Advised to stop the cholestyramine as the number of tablets and the limit have been reached.  Advised to take Tylenol or ibuprofen for pain relief. Advised take prednisone that you already have at home, 30 mg daily for about 4 to 5 days to reduce the acute inflammation. Advised to follow-up with PCP or return to urgent care if symptoms fail to improve.    ED Prescriptions     Medication Sig  Dispense Auth. Provider   allopurinol (ZYLOPRIM) 100 MG tablet TAKE 1 TABLET(100 MG) BY MOUTH DAILY. START AFTER FLARE RESOLVES  90 tablet Nyoka Lint, PA-C      PDMP not reviewed this encounter.   Nyoka Lint, PA-C 07/27/22 1519

## 2022-07-27 NOTE — Telephone Encounter (Signed)
Medication Refill - Medication: allopurinol (ZYLOPRIM) 100 MG tablet  Pt stated he went to Urgent care 07/26/2022 for gout flare up and asked medication allopurinol (ZYLOPRIM) 100 MG tablet because this is what helps him, but they declined to prescribe. Pt declined to speak with NT just wants to see if PCP will fill medication. Pt declined an appointment. Pt stated he was recently seen in office as well 07/15/22.  Has the patient contacted their pharmacy? No.  (Agent: If yes, when and what did the pharmacy advise?)  Preferred Pharmacy (with phone number or street name):  [facility] ([address]), [place] [address]  [address] [place]) [place] [address]  Phone: [phone] Fax: [phone]  Hours: Not open 24 hours   Has the patient been seen for an appointment in the last year OR does the patient have an upcoming appointment? Yes.    Agent: Please be advised that RX refills may take up to 3 business days. We ask that you follow-up with your pharmacy.

## 2022-07-27 NOTE — Discharge Instructions (Signed)
Advised to stop the cholestyramine as the number of tablets and the limit have been reached.  Advised to take Tylenol or ibuprofen for pain relief. Advised take prednisone that you already have at home, 30 mg daily for about 4 to 5 days to reduce the acute inflammation. Advised to follow-up with PCP or return to urgent care if symptoms fail to improve.

## 2022-07-28 DIAGNOSIS — M10079 Idiopathic gout, unspecified ankle and foot: Secondary | ICD-10-CM | POA: Diagnosis not present

## 2022-07-29 NOTE — Progress Notes (Addendum)
Patient ID: Luis Harrington, male    DOB: 31-Jul-1966  MRN: [id number]  CC: Follow-up   Subjective: Luis Harrington is a 56 y.o. male who presents for follow-up.  His concerns today include:  07/15/2022 with Freeman Caldron, PA: 1. Essential hypertension With hypertensive urgency-compliance imperative.  Discussed at length.  Discussed risks associated with BP as high as his including stroke, etc as well as mortality.  He is not willing to take lisinopril or HCT anymore.  He feels it makes him constipated and have ED.  He wants to try losartan and is willing to take it bc it can improve ED  - losartan (COZAAR) 50 MG tablet; Take 1 tablet (50 mg total) by mouth daily.  Dispense: 90 tablet; Refill: 0 Check blood pressure daily and record and bring to next visit.    2. Other acute pulmonary embolism without acute cor pulmonale (HCC) - apixaban (ELIQUIS) 5 MG TABS tablet; Take 1 tablet (5 mg total) by mouth 2 (two) times daily.  Dispense: 60 tablet; Refill: 2 - acetaminophen-codeine (TYLENOL #3) 300-30 MG tablet; Take 1 tablet by mouth every 4 (four) hours as needed for moderate pain.  Dispense: 10 tablet; Refill: 0   3.  Hospital f/up  Work on eliminating cigs and alcohol.    07/26/2022 [facility] [place] per PA note: Will increase losartan dose to 100 mg daily and recommended he continue to monitor BP at home and log as recommended. He is to follow up with PCP in about 15 days. Encouraged evaluation in the ED immediately with any chest pain, shortness of breath, headache, etc.  Patient expresses understanding.    Colchicine prescribed for suspected gout flare. Recommended he not restart allopurinol at this time- recommended he discuss at follow up with PCP in 2 weeks if symptoms are clear. Encouraged sooner follow up with any further concerns.    07/27/2022 [facility] [place] per PA note: Plan: 1.  The acute gouty arthritis flare will be treated with the  following: A.  Advised patient to take Tylenol or ibuprofen for pain relief. B.  Advised patient to take the prednisone that he has at home, 30 mg 3-4 times a day to help reduce the acute inflammation. C.  Advised patient to follow-up with PCP or return to urgent care if symptoms fail to improve.  Today's visit 08/06/2022: - Patient presents today for blood pressure check. States he is taking Losartan as prescribed daily without missing doses. Home blood pressures similar to today's. Acknowledges that his blood pressures are above goal. Denies red flag symptoms on today.   I discussed with patient beginning him on a second blood pressure medication in addition to Losartan. Patient states he does not like to take "all of these 9 medications" . Patient states concerns about what could potentially happen to his body when all of the medications are "combined inside my body". I discussed with patient there are potential side effects with all medications that he takes. However, there are potential side effects and or complications with having his chronic conditions left untreated. Patient verbalized understanding.   During discussion with patient I confirmed if he is taking Losartan 100 mg daily as prescribed during 07/26/2022 [facility] visit. Patient states that he is only taking Losartan 50 mg daily. States he never began the Losartan 100 mg dose as directed during 07/26/2022 visit at [facility]. Patient confirmed that he is intolerant to Hydrochlorothiazide and Lisinopril.  Patient confirmed that he was taking Amlodipine in the past without issues or concerns and feels comfortable resuming the medication again. I discussed with patient to take Losartan 100 mg daily and Amlodipine as prescribed and to return in 1 week for blood pressure check. 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 I discussed with patient it is best if he is assessed in-person for his  next appointment and he verbalized understanding.   Patient states he wants to know if Atorvastatin (of which he reported he ever began taking) and Cialis will help improve his blood pressure. States he read online Cialis "opens up blood flow" I discussed with patient that Atorvastatin is primarily used for high cholesterol management and Cialis may assist with his erectile dysfunction but it will not replace blood pressure medications. Patient verbalized understanding.   Discussed with patient recommendation to give Hydralazine one dose today in office and have him sit for repeat blood pressure check. Patient agreeable. Margorie JohnEboney Williams, CMA administered Hydralazine and rechecked patient's blood pressure which did not improve.   I returned to patient's room at that time and discussed with patient considering no improvement of his blood pressure, which is at a critical level, that I will call an ambulance to take him to the emergency department for further evaluation and management. Patient begins shaking his head and states "My blood pressure was just as high the last time I was here and they didn't send me to the emergency room" I discussed with patient that the last time he was here at [facility] [place] on 07/15/2022 his blood pressure was elevated but not to the degree of elevation on today. Patient states "Im going to call my wife and let her take me to the emergency room" I told patient that will be ok and to make his wife aware that when she arrives at the office to let front desk know that Margorie JohnEboney Williams, CMA will bring her back to his room so that I can discuss with her, patient agreeable. I told patient I am going to step out of the room so that he can converse with his wife. I went to stand at the nursing station located by patient's room.   Shortly after patient peaked his head out into the hallway and asked me to return to the room. Patient states "I do have the right to leave don't I?" I  informed patient that he does have the right to leave after signing Against Medical Advice [facility](AMA) form. Patient states he will sign AMA. I discussed with patient in detail what the AMA includes and patient had an opportunity to review document prior to signing. Patient signed document and stated that he already has a copy available for review in MyChart. After signing AMA patient went toward the front desk and I am told he did not schedule a follow-up appointment.   Note: It was reported to me by Margorie JohnEboney Williams, CMA that during rooming process patient stated he wanted to see an MD and if I will be his primary care provider. During my discussion with patient regarding recommendation for blood pressure check in 1 week I informed patient that per his request his follow-up will be scheduled with Georganna SkeansAmelia Wilson, MD and patient did not decline. This is not the first occurrence where patient has had a similar discussion regarding who is or will be his primary care provider. During appointment to establish care with me on 02/05/2021 patient  stated he was considering returning to Jacquiline Doe, MD for primary care. Patient subsequently had appointment with me on 11/26/2021 and then again on today. Margorie John, CMA did confirm that she notified practice manager Theodoro Parma of patient's preference for an MD to manage care.   - States he is taking Eliquis as prescribed. Denies red flag symptoms. Question concerning next step with Eliquis management.   - Requesting trial of Cialis to assist with erectile dysfunction.   - States gout resolved and no longer taking Colchicine or Prednisone.  Patient Active Problem List   Diagnosis Date Noted   History of gout 12/26/2020   Hyperglycemia 04/07/2018   Dyslipidemia 04/07/2018   Gout 04/06/2018   Essential hypertension 04/06/2018     Current Outpatient Medications on File Prior to Visit  Medication Sig Dispense Refill   acetaminophen-codeine (TYLENOL #3) 300-30 MG  tablet Take 1 tablet by mouth every 4 (four) hours as needed for moderate pain. 10 tablet 0   apixaban (ELIQUIS) 5 MG TABS tablet Take 1 tablet (5 mg total) by mouth 2 (two) times daily. 60 tablet 2   atorvastatin (LIPITOR) 20 MG tablet TAKE 1 TABLET(20 MG) BY MOUTH DAILY (Patient not taking: Reported on 08/06/2022) 30 tablet 1   losartan (COZAAR) 50 MG tablet Take 2 tablets (100 mg total) by mouth daily. (Patient taking differently: Take 50 mg by mouth daily.) 90 tablet 0   No current facility-administered medications on file prior to visit.    Allergies  Allergen Reactions   Shrimp [Shellfish Allergy] Itching    Social History   Socioeconomic History   Marital status: Married    Spouse name: Not on file   Number of children: Not on file   Years of education: Not on file   Highest education level: Not on file  Occupational History   Not on file  Tobacco Use   Smoking status: Some Days    Types: Cigarettes    Passive exposure: Current   Smokeless tobacco: Never  Vaping Use   Vaping Use: Never used  Substance and Sexual Activity   Alcohol use: Yes   Drug use: No   Sexual activity: Yes    Partners: Female  Other Topics Concern   Not on file  Social History Narrative   Not on file   Social Determinants of Health   Financial Resource Strain: Not on file  Food Insecurity: Not on file  Transportation Needs: Not on file  Physical Activity: Not on file  Stress: Not on file  Social Connections: Not on file  Intimate Partner Violence: Not on file    Family History  Problem Relation Age of Onset   Diabetes Mother    Hypertension Mother    Diabetes Father    Hypertension Father     Past Surgical History:  Procedure Laterality Date   TENDON REPAIR     Finger    ROS: Review of Systems Negative except as stated above  PHYSICAL EXAM: BP (!) 215/148 (BP Location: Left Arm, Patient Position: Sitting, Cuff Size: Large)   Pulse 94   Temp 98.3 F (36.8 C)   Resp  16   Ht 5' 11.5" (1.816 m)   Wt 240 lb (108.9 kg)   SpO2 97%   BMI 33.01 kg/m   Physical Exam HENT:     Head: Normocephalic and atraumatic.  Eyes:     Extraocular Movements: Extraocular movements intact.     Conjunctiva/sclera: Conjunctivae normal.     Pupils:  Pupils are equal, round, and reactive to light.  Cardiovascular:     Rate and Rhythm: Normal rate and regular rhythm.     Pulses: Normal pulses.     Heart sounds: Normal heart sounds.  Pulmonary:     Effort: Pulmonary effort is normal.     Breath sounds: Normal breath sounds.  Musculoskeletal:     Cervical back: Normal range of motion and neck supple.  Neurological:     General: No focal deficit present.     Mental Status: He is alert and oriented to person, place, and time.  Psychiatric:        Thought Content: Thought content normal.    ASSESSMENT AND PLAN: 1. Hypertensive urgency 2. Uncontrolled hypertension 3. Nonadherence to medication 4. Current smoker 5. Alcohol use - Blood pressure not at goal during today's visit. Patient asymptomatic without chest pressure, chest pain, palpitations, shortness of breath, worst headache of life, and any additional red flag symptoms.  - Hydralazine administered in office without improvement of blood pressure.  - I discussed with patient considering critical level of blood pressure to report to the emergency department by EMS for further evaluation and management. Patient declined and signed Against Medical Advice form. - Patient intolerant to Hydrochlorothiazide. - Patient intolerant to Lisinopril. - Patient nonadherent to medication regimen. Patient reports he has been taking Losartan 50 mg daily instead of 100 mg daily. Discussed with patient importance of taking Losartan as prescribed. No refills needed as of present.  - Begin Amlodipine as prescribed. Counseled on medication adherence/adverse effects.  - Counseled on blood pressure goal of less than 130/80, low-sodium, DASH  diet, medication compliance, and 150 minutes of moderate intensity exercise per week as tolerated. Counseled on medication adherence and adverse effects. - Referral to Cardiology for further evaluation and management. During the interim of establishment at Cardiology patient to see primary care for blood pressure checks. - Follow-up with Georganna Skeans, MD for blood pressure check in 1 week or sooner if needed.  - hydrALAZINE (APRESOLINE) tablet 50 mg - amLODipine (NORVASC) 5 MG tablet; Take 1 tablet (5 mg total) by mouth daily.  Dispense: 30 tablet; Refill: 2 - Ambulatory referral to Cardiology  6. Dyslipidemia - Patient nonadherent to Atorvastatin regimen.   7. Other acute pulmonary embolism without acute cor pulmonale (HCC) - Patient today in office without cardiopulmonary distress.  - Continue Apixaban as prescribed. No refills needed as of present.  - Referral to Pulmonology for further evaluation and management.  - Ambulatory referral to Pulmonology  8. Erectile dysfunction, unspecified erectile dysfunction type - Tadalafil as prescribed. Counseled on medication adherence and adverse effects.  - Follow-up with primary provider as scheduled.  - tadalafil (CIALIS) 10 MG tablet; Take 1 tablet 1/2 hour to 1 hour prior to intercourse as needed. Limit use to 1/2 tablet or 1 tablet per 24 hours.  Dispense: 30 tablet; Refill: 1  9. Acute gout of ankle, unspecified cause, unspecified laterality 10. History of gout - Resolved.  - Follow-up with primary care as needed.    Patient was given the opportunity to ask questions.  Patient verbalized understanding of the plan and was able to repeat key elements of the plan. Patient was given clear instructions to go to Emergency Department or return to medical center if symptoms don't improve, worsen, or new problems develop.The patient verbalized understanding.   Orders Placed This Encounter  Procedures   Ambulatory referral to Pulmonology    Ambulatory referral to Cardiology  Requested Prescriptions   Signed Prescriptions Disp Refills   tadalafil (CIALIS) 10 MG tablet 30 tablet 1    Sig: Take 1 tablet 1/2 hour to 1 hour prior to intercourse as needed. Limit use to 1/2 tablet or 1 tablet per 24 hours.   amLODipine (NORVASC) 5 MG tablet 30 tablet 2    Sig: Take 1 tablet (5 mg total) by mouth daily.    Return in about 1 week (around 08/13/2022) for Follow-Up or next available bp check with Georganna Skeans, MD.  Rema Fendt, NP

## 2022-08-06 ENCOUNTER — Encounter: Payer: Self-pay | Admitting: Family

## 2022-08-06 ENCOUNTER — Ambulatory Visit (INDEPENDENT_AMBULATORY_CARE_PROVIDER_SITE_OTHER): Payer: BC Managed Care – PPO | Admitting: Family

## 2022-08-06 ENCOUNTER — Other Ambulatory Visit: Payer: Self-pay

## 2022-08-06 VITALS — BP 215/148 | HR 94 | Temp 98.3°F | Resp 16 | Ht 71.5 in | Wt 240.0 lb

## 2022-08-06 DIAGNOSIS — Z8739 Personal history of other diseases of the musculoskeletal system and connective tissue: Secondary | ICD-10-CM

## 2022-08-06 DIAGNOSIS — E785 Hyperlipidemia, unspecified: Secondary | ICD-10-CM | POA: Diagnosis not present

## 2022-08-06 DIAGNOSIS — I2699 Other pulmonary embolism without acute cor pulmonale: Secondary | ICD-10-CM

## 2022-08-06 DIAGNOSIS — I10 Essential (primary) hypertension: Secondary | ICD-10-CM | POA: Diagnosis not present

## 2022-08-06 DIAGNOSIS — F17210 Nicotine dependence, cigarettes, uncomplicated: Secondary | ICD-10-CM

## 2022-08-06 DIAGNOSIS — Z789 Other specified health status: Secondary | ICD-10-CM

## 2022-08-06 DIAGNOSIS — F17200 Nicotine dependence, unspecified, uncomplicated: Secondary | ICD-10-CM

## 2022-08-06 DIAGNOSIS — N529 Male erectile dysfunction, unspecified: Secondary | ICD-10-CM

## 2022-08-06 DIAGNOSIS — I160 Hypertensive urgency: Secondary | ICD-10-CM

## 2022-08-06 DIAGNOSIS — M109 Gout, unspecified: Secondary | ICD-10-CM

## 2022-08-06 DIAGNOSIS — Z91148 Patient's other noncompliance with medication regimen for other reason: Secondary | ICD-10-CM

## 2022-08-06 MED ORDER — TADALAFIL 10 MG PO TABS: ORAL_TABLET | ORAL | 1 refills | Status: DC

## 2022-08-06 MED ORDER — AMLODIPINE BESYLATE 5 MG PO TABS: 5.0000 mg | ORAL_TABLET | Freq: Every day | ORAL | 2 refills | Status: DC

## 2022-08-06 MED ORDER — HYDRALAZINE HCL 10 MG PO TABS
50.0000 mg | ORAL_TABLET | Freq: Once | ORAL | Status: AC
  Administered 2022-08-06: 50 mg via ORAL

## 2022-08-06 NOTE — Progress Notes (Signed)
Pt presents for follow-up -Losartan change to 100mg  pt only taking 50mg   -pt states he never started Atorvastatin

## 2022-08-10 ENCOUNTER — Ambulatory Visit: Payer: PRIVATE HEALTH INSURANCE | Admitting: Family

## 2022-08-13 ENCOUNTER — Encounter: Payer: Self-pay | Admitting: Family Medicine

## 2022-08-13 ENCOUNTER — Ambulatory Visit (INDEPENDENT_AMBULATORY_CARE_PROVIDER_SITE_OTHER): Payer: BC Managed Care – PPO | Admitting: Family Medicine

## 2022-08-13 VITALS — BP 176/128 | HR 86 | Temp 98.1°F | Resp 16 | Wt 242.6 lb

## 2022-08-13 DIAGNOSIS — I10 Essential (primary) hypertension: Secondary | ICD-10-CM

## 2022-08-13 MED ORDER — AMLODIPINE BESYLATE 10 MG PO TABS: 10.0000 mg | ORAL_TABLET | Freq: Every day | ORAL | 0 refills | Status: DC

## 2022-08-13 MED ORDER — LOSARTAN POTASSIUM 100 MG PO TABS: 100.0000 mg | ORAL_TABLET | Freq: Every day | ORAL | 0 refills | Status: DC

## 2022-08-13 MED ORDER — HYDROCHLOROTHIAZIDE 25 MG PO TABS: 25.0000 mg | ORAL_TABLET | Freq: Every day | ORAL | 1 refills | Status: DC

## 2022-08-13 NOTE — Progress Notes (Signed)
Patient is here with concerns regarding his hypertension. Patient brought in his personal BP cuff  as well.

## 2022-08-13 NOTE — Progress Notes (Signed)
Established Patient Office Visit  Subjective    Patient ID: Luis Harrington, male    DOB: [birth year]  Age: 56 y.o. MRN: [id number]  CC:  Chief Complaint  Patient presents with   Follow-up   Hypertension    HPI Luis Harrington presents for follow up of uncontrolled hypertension. Patient denies acute complaints.    Outpatient Encounter Medications as of 08/13/2022  Medication Sig   amLODipine (NORVASC) 10 MG tablet Take 1 tablet (10 mg total) by mouth daily.   hydrochlorothiazide (HYDRODIURIL) 25 MG tablet Take 1 tablet (25 mg total) by mouth daily.   losartan (COZAAR) 100 MG tablet Take 1 tablet (100 mg total) by mouth daily.   acetaminophen-codeine (TYLENOL #3) 300-30 MG tablet Take 1 tablet by mouth every 4 (four) hours as needed for moderate pain.   apixaban (ELIQUIS) 5 MG TABS tablet Take 1 tablet (5 mg total) by mouth 2 (two) times daily.   atorvastatin (LIPITOR) 20 MG tablet TAKE 1 TABLET(20 MG) BY MOUTH DAILY (Patient not taking: Reported on 08/06/2022)   tadalafil (CIALIS) 10 MG tablet Take 1 tablet 1/2 hour to 1 hour prior to intercourse as needed. Limit use to 1/2 tablet or 1 tablet per 24 hours.   [DISCONTINUED] amLODipine (NORVASC) 5 MG tablet Take 1 tablet (5 mg total) by mouth daily.   [DISCONTINUED] losartan (COZAAR) 50 MG tablet Take 2 tablets (100 mg total) by mouth daily. (Patient taking differently: Take 50 mg by mouth daily.)   No facility-administered encounter medications on file as of 08/13/2022.    Past Medical History:  Diagnosis Date   Gout    Hypertension     Past Surgical History:  Procedure Laterality Date   TENDON REPAIR     Finger    Family History  Problem Relation Age of Onset   Diabetes Mother    Hypertension Mother    Diabetes Father    Hypertension Father     Social History   Socioeconomic History   Marital status: Married    Spouse name: Not on file   Number of children: Not on file   Years of education: Not on file    Highest education level: Not on file  Occupational History   Not on file  Tobacco Use   Smoking status: Some Days    Types: Cigarettes    Passive exposure: Current   Smokeless tobacco: Never  Vaping Use   Vaping Use: Never used  Substance and Sexual Activity   Alcohol use: Yes   Drug use: No   Sexual activity: Yes    Partners: Female  Other Topics Concern   Not on file  Social History Narrative   Not on file   Social Determinants of Health   Financial Resource Strain: Not on file  Food Insecurity: Not on file  Transportation Needs: Not on file  Physical Activity: Not on file  Stress: Not on file  Social Connections: Not on file  Intimate Partner Violence: Not on file    Review of Systems  All other systems reviewed and are negative.       Objective    BP (!) 176/128   Pulse 86   Temp 98.1 F (36.7 C) (Oral)   Resp 16   Wt 242 lb 9.6 oz (110 kg)   SpO2 98%   BMI 33.37 kg/m   Physical Exam Vitals and nursing note reviewed.  Constitutional:      General: He is not in acute distress. Cardiovascular:  Rate and Rhythm: Normal rate and regular rhythm.  Pulmonary:     Effort: Pulmonary effort is normal.     Breath sounds: Normal breath sounds.  Abdominal:     Palpations: Abdomen is soft.     Tenderness: There is no abdominal tenderness.  Musculoskeletal:     Right lower leg: No edema.     Left lower leg: No edema.  Neurological:     General: No focal deficit present.     Mental Status: He is alert and oriented to person, place, and time.         Assessment & Plan:   1. Uncontrolled hypertension Elevated readings. Patient amlodipine increased from 5 mg to 10 mg daily. Losartan increased from 50 mg to 100mg  daily.HCTZ 25 mg daily added to regimen. Monitor.     No follow-ups on file.   Becky Sax, MD

## 2022-08-26 ENCOUNTER — Encounter: Payer: Self-pay | Admitting: Internal Medicine

## 2022-08-26 ENCOUNTER — Ambulatory Visit: Payer: BC Managed Care – PPO | Attending: Internal Medicine | Admitting: Internal Medicine

## 2022-08-26 VITALS — BP 189/119 | HR 85 | Ht 71.0 in | Wt 242.2 lb

## 2022-08-26 DIAGNOSIS — I10 Essential (primary) hypertension: Secondary | ICD-10-CM | POA: Diagnosis not present

## 2022-08-26 MED ORDER — CARVEDILOL 25 MG PO TABS: 25.0000 mg | ORAL_TABLET | Freq: Two times a day (BID) | ORAL | 3 refills | Status: DC

## 2022-08-26 NOTE — Progress Notes (Addendum)
Cardiology Office Note:    Date:  08/26/2022   ID:  Luis Harrington, DOB Apr 01, 1966, MRN [id number]  PCP:  Georganna Skeans, MD   [facility] Providers Cardiologist:  Maisie Fus, MD     Referring MD: Rema Fendt, NP   No chief complaint on file. HTN  History of Present Illness:    Luis Harrington is a 56 y.o. male with a hx of PE, HTN, ECG 06/2022-NSR, , NSR, LAE, LAD, LVH, IRBBB referral to cardiology for HTN. Blood pressures 238/143 mmHg with gout.  Blood pressure today 189/119. He notes his blood pressure has been high for over a year. No prior cardiac hx.  Smokes cigarettes 3 per day. Family Hx: hypertension. No headaches, blurry vision, no Lh, no dizziness.   Past Medical History:  Diagnosis Date   Gout    Hypertension     Past Surgical History:  Procedure Laterality Date   TENDON REPAIR     Finger    Current Medications: Current Meds  Medication Sig   acetaminophen-codeine (TYLENOL #3) 300-30 MG tablet Take 1 tablet by mouth every 4 (four) hours as needed for moderate pain.   amLODipine (NORVASC) 10 MG tablet Take 1 tablet (10 mg total) by mouth daily.   apixaban (ELIQUIS) 5 MG TABS tablet Take 1 tablet (5 mg total) by mouth 2 (two) times daily. (Patient taking differently: Take 5 mg by mouth daily.)   atorvastatin (LIPITOR) 20 MG tablet TAKE 1 TABLET(20 MG) BY MOUTH DAILY   carvedilol (COREG) 25 MG tablet Take 1 tablet (25 mg total) by mouth 2 (two) times daily.   hydrochlorothiazide (HYDRODIURIL) 25 MG tablet Take 1 tablet (25 mg total) by mouth daily.   losartan (COZAAR) 100 MG tablet Take 1 tablet (100 mg total) by mouth daily.   tadalafil (CIALIS) 10 MG tablet Take 1 tablet 1/2 hour to 1 hour prior to intercourse as needed. Limit use to 1/2 tablet or 1 tablet per 24 hours.     Allergies:   Shrimp [shellfish allergy]   Social History   Socioeconomic History   Marital status: Married    Spouse name: Not on file   Number of children: Not on file    Years of education: Not on file   Highest education level: Not on file  Occupational History   Not on file  Tobacco Use   Smoking status: Some Days    Types: Cigarettes    Passive exposure: Current   Smokeless tobacco: Never  Vaping Use   Vaping Use: Never used  Substance and Sexual Activity   Alcohol use: Yes   Drug use: No   Sexual activity: Yes    Partners: Female  Other Topics Concern   Not on file  Social History Narrative   Not on file   Social Determinants of Health   Financial Resource Strain: Not on file  Food Insecurity: Not on file  Transportation Needs: Not on file  Physical Activity: Not on file  Stress: Not on file  Social Connections: Not on file     Family History: The patient's family history includes Diabetes in his father and mother; Hypertension in his father and mother.  ROS:   Please see the history of present illness.     All other systems reviewed and are negative.  EKGs/Labs/Other Studies Reviewed:    The following studies were reviewed today:   EKG:  EKG is  ordered today.  The ekg ordered today demonstrates  08/26/2022- sinus bradycardia with 1st AV block, LAE, LAD, IRBBB Recent Labs: 07/12/2022: ALT 22; BUN 10; Creatinine, Ser 1.61; Hemoglobin 14.3; Platelets 256; Potassium 4.4; Sodium 139   Recent Lipid Panel    Component Value Date/Time   CHOL 231 (H) 12/30/2020 1048   TRIG 149 12/30/2020 1048   HDL 36 (L) 12/30/2020 1048   CHOLHDL 6.4 (H) 12/30/2020 1048   CHOLHDL 6 04/06/2018 0855   VLDL 27.8 04/06/2018 0855   LDLCALC 168 (H) 12/30/2020 1048       Physical Exam:    VS:   Vitals:   08/26/22 1005  BP: (!) 189/119  Pulse: 85  SpO2: 97%     Wt Readings from Last 3 Encounters:  08/26/22 242 lb 3.2 oz (109.9 kg)  08/13/22 242 lb 9.6 oz (110 kg)  08/06/22 240 lb (108.9 kg)     GEN:  Well nourished, well developed in no acute distress HEENT: Normal NECK: No JVD; No carotid bruits LYMPHATICS: No  lymphadenopathy CARDIAC: RRR, no murmurs, rubs, gallops RESPIRATORY:  Clear to auscultation without rales, wheezing or rhonchi  ABDOMEN: Soft, non-tender, non-distended MUSCULOSKELETAL:  No edema; No deformity  SKIN: Warm and dry NEUROLOGIC:  Alert and oriented x 3 PSYCHIATRIC:  Normal affect   ASSESSMENT:    Hypertensive heart disease: continue losartan 100 mg daily, HCTz 25 mg daily, and norvasc 10 mg daily. Will start carvedilol 25 mg BID. Will start secondary causes w/u with renal duplex. Will refer to the hypertension clinic with significantly elevated Bps.  Pulmonary Embolism: possibly provoked , he drives a truck. continue eliquis 5 mg BID for at least 3-6 months. Can be followed by his PCP  PLAN:    In order of problems listed above:  Renal duplex  Hypertension clinic Carvedilol 25 mg BID     Medication Adjustments/Labs and Tests Ordered: Current medicines are reviewed at length with the patient today.  Concerns regarding medicines are outlined above.  Orders Placed This Encounter  Procedures   AMB REFERRAL TO ADVANCED HTN CLINIC   EKG 12-Lead   VAS US RENAL ARTERY DUPLEX   Meds ordered this encounter  Medications   carvedilol (COREG) 25 MG tablet    Sig: Take 1 tablet (25 mg total) by mouth 2 (two) times daily.    Dispense:  180 tablet    Refill:  3    Patient Instructions  Medication Instructions:  START: CARVEDILOL 25mg  TWICE DAILY  *If you need a refill on your cardiac medications before your next appointment, please call your pharmacy*  Lab Work: None Ordered At This Time.  If you have labs (blood work) drawn today and your tests are completely normal, you will receive your results only by: MyChart Message (if you have MyChart) OR A paper copy in the mail If you have any lab test that is abnormal or we need to change your treatment, we will call you to review the results.  Testing/Procedures:  Your physician has requested that you have a renal artery  duplex. During this test, an ultrasound is used to evaluate blood flow to the kidneys. Take your medications as you usually do. This will take place at [address], [address].  No food after 11PM the night before.  Water is OK. (Don't drink liquids if you have been instructed not to for ANOTHER test). Avoid foods that produce bowel gas, for 24 hours prior to exam (see below). No breakfast, no chewing gum, no smoking or carbonated beverages. Patient  may take morning medications with water. Come in for test at least 15 minutes early to register.   Follow-Up: At [facility], you and your health needs are our priority.  As part of our continuing mission to provide you with exceptional heart care, we have created designated Provider Care Teams.  These Care Teams include your primary Cardiologist (physician) and Advanced Practice Providers (APPs -  Physician Assistants and Nurse Practitioners) who all work together to provide you with the care you need, when you need it.  Your next appointment:   AS NEEDED   The format for your next appointment:   In Person  Provider:   Janina Mayo, MD     Other Instructions YOU ARE BEING REFERRED TO Dr. Oval Linsey FOR HYPERTENSION CLINIC- SOMEONE WILL REACH OUT TO YOU REGARDING THIS          Signed, Janina Mayo, MD  08/26/2022 10:32 AM    [place]

## 2022-08-26 NOTE — Patient Instructions (Signed)
Medication Instructions:  START: CARVEDILOL 25mg  TWICE DAILY  *If you need a refill on your cardiac medications before your next appointment, please call your pharmacy*  Lab Work: None Ordered At This Time.  If you have labs (blood work) drawn today and your tests are completely normal, you will receive your results only by: [place] (if you have MyChart) OR A paper copy in the mail If you have any lab test that is abnormal or we need to change your treatment, we will call you to review the results.  Testing/Procedures:  Your physician has requested that you have a renal artery duplex. During this test, an ultrasound is used to evaluate blood flow to the kidneys. Take your medications as you usually do. This will take place at [place], [address].  No food after 11PM the night before.  Water is OK. (Don't drink liquids if you have been instructed not to for ANOTHER test). Avoid foods that produce bowel gas, for 24 hours prior to exam (see below). No breakfast, no chewing gum, no smoking or carbonated beverages. Patient may take morning medications with water. Come in for test at least 15 minutes early to register.   Follow-Up: At [facility], you and your health needs are our priority.  As part of our continuing mission to provide you with exceptional heart care, we have created designated Provider Care Teams.  These Care Teams include your primary Cardiologist (physician) and Advanced Practice Providers (APPs -  Physician Assistants and Nurse Practitioners) who all work together to provide you with the care you need, when you need it.  Your next appointment:   AS NEEDED   The format for your next appointment:   In Person  Provider:   Janina Mayo, MD     Other Instructions YOU ARE BEING REFERRED TO Dr. Oval Linsey FOR HYPERTENSION CLINIC- SOMEONE WILL REACH OUT TO YOU REGARDING THIS

## 2022-09-02 ENCOUNTER — Ambulatory Visit (HOSPITAL_COMMUNITY)
Admission: RE | Admit: 2022-09-02 | Discharge: 2022-09-02 | Disposition: A | Discharge status: Home / Self Care | Payer: BC Managed Care – PPO | Source: Ambulatory Visit | Attending: Internal Medicine | Admitting: Internal Medicine

## 2022-09-02 ENCOUNTER — Other Ambulatory Visit: Payer: Self-pay

## 2022-09-02 DIAGNOSIS — I10 Essential (primary) hypertension: Secondary | ICD-10-CM | POA: Diagnosis not present

## 2022-09-08 ENCOUNTER — Other Ambulatory Visit: Payer: Self-pay

## 2022-09-14 ENCOUNTER — Other Ambulatory Visit: Payer: Self-pay

## 2022-09-16 ENCOUNTER — Other Ambulatory Visit: Payer: Self-pay

## 2022-10-13 NOTE — Progress Notes (Incomplete)
Advanced Hypertension Clinic Initial Assessment:    Date:  10/13/2022   ID:  Luis Harrington, DOB 12/10/1965, MRN [id number]  PCP:  Georganna Skeans, MD  Cardiologist:  Maisie Fus, MD  Nephrologist:  Referring MD: Maisie Fus, MD   CC: Hypertension  History of Present Illness:    Luis Harrington is a 56 y.o. male with a hx of hypertension, *** here to establish care in the Advanced Hypertension Clinic.   Today,  *** denies any palpitations, chest pain, shortness of breath, or peripheral edema. No lightheadedness, headaches, syncope, orthopnea, or PND.  (+)  Previous antihypertensives:   Past Medical History:  Diagnosis Date   Gout    Hypertension     Past Surgical History:  Procedure Laterality Date   TENDON REPAIR     Finger    Current Medications: No outpatient medications have been marked as taking for the 10/14/22 encounter (Appointment) with Chilton Si, MD.     Allergies:   Shrimp [shellfish allergy]   Social History   Socioeconomic History   Marital status: Married    Spouse name: Not on file   Number of children: Not on file   Years of education: Not on file   Highest education level: Not on file  Occupational History   Not on file  Tobacco Use   Smoking status: Some Days    Types: Cigarettes    Passive exposure: Current   Smokeless tobacco: Never  Vaping Use   Vaping Use: Never used  Substance and Sexual Activity   Alcohol use: Yes   Drug use: No   Sexual activity: Yes    Partners: Female  Other Topics Concern   Not on file  Social History Narrative   Not on file   Social Determinants of Health   Financial Resource Strain: Not on file  Food Insecurity: Not on file  Transportation Needs: Not on file  Physical Activity: Not on file  Stress: Not on file  Social Connections: Not on file     Family History: The patient's family history includes Diabetes in his father and mother; Hypertension in his father and  mother.  ROS:   Please see the history of present illness.     All other systems reviewed and are negative.  EKGs/Labs/Other Studies Reviewed:    ***  EKG:   : Sinus ***. Rate *** bpm.  Recent Labs: 07/12/2022: ALT 22; BUN 10; Creatinine, Ser 1.61; Hemoglobin 14.3; Platelets 256; Potassium 4.4; Sodium 139   Recent Lipid Panel    Component Value Date/Time   CHOL 231 (H) 12/30/2020 1048   TRIG 149 12/30/2020 1048   HDL 36 (L) 12/30/2020 1048   CHOLHDL 6.4 (H) 12/30/2020 1048   CHOLHDL 6 04/06/2018 0855   VLDL 27.8 04/06/2018 0855   LDLCALC 168 (H) 12/30/2020 1048    Physical Exam:    VS:  There were no vitals taken for this visit. , BMI There is no height or weight on file to calculate BMI. GENERAL:  Well appearing HEENT: Pupils equal round and reactive, fundi not visualized, oral mucosa unremarkable NECK:  No jugular venous distention, waveform within normal limits, carotid upstroke brisk and symmetric, no bruits, no thyromegaly LYMPHATICS:  No cervical adenopathy LUNGS:  Clear to auscultation bilaterally HEART:  RRR.  PMI not displaced or sustained,S1 and S2 within normal limits, no S3, no S4, no clicks, no rubs, *** murmurs ABD:  Flat, positive bowel sounds normal in frequency in pitch, no bruits,  no rebound, no guarding, no midline pulsatile mass, no hepatomegaly, no splenomegaly EXT:  2 plus pulses throughout, no edema, no cyanosis, no clubbing SKIN:  No rashes, no nodules NEURO:  Cranial nerves II through XII grossly intact, motor grossly intact throughout PSYCH:  Cognitively intact, oriented to person place and time   ASSESSMENT/PLAN:    No problem-specific Assessment & Plan notes found for this encounter.   Screening for Secondary Hypertension: { Click here to document screening for secondary causes of HTN  :[id number]}    Relevant Labs/Studies:    Latest Ref Rng & Units 07/12/2022    8:50 AM 12/30/2020   10:48 AM 04/06/2018    8:55 AM  Basic Labs  Sodium  135 - 145 mmol/L 139  136  137   Potassium 3.5 - 5.1 mmol/L 4.4  4.8  4.6   Creatinine 0.61 - 1.24 mg/dL 7.29  0.21  1.15        Latest Ref Rng & Units 12/30/2020   10:48 AM 04/06/2018    8:55 AM  Thyroid   TSH 0.450 - 4.500 uIU/mL 2.200  1.30                 09/02/2022   12:09 PM  Renovascular   Renal Artery [place] Completed Yes        he consents to be monitored in our remote patient monitoring program through [employer].  he will track his blood pressure twice daily and understands that these trends will help [place] to adjust his medications as needed prior to his next appointment.  he *** interested in enrolling in the PREP exercise and nutrition program through the [facility].     Disposition:   *** FU with APP/PharmD in 1 month for the next 3 months.   FU with Tiffany C. Duke Salvia, MD, [facility] in 4 months.   Medication Adjustments/Labs and Tests Ordered: Current medicines are reviewed at length with the patient today.  Concerns regarding medicines are outlined above.   No orders of the defined types were placed in this encounter.  No orders of the defined types were placed in this encounter.   I,Mathew Stumpf,acting as a Neurosurgeon for Chilton Si, MD.,have documented all relevant documentation on the behalf of Chilton Si, MD,as directed by  Chilton Si, MD while in the presence of Chilton Si, MD.  ***  Signed, Carlena Bjornstad  10/13/2022 11:47 AM    [facility]

## 2022-10-14 ENCOUNTER — Ambulatory Visit (HOSPITAL_BASED_OUTPATIENT_CLINIC_OR_DEPARTMENT_OTHER): Payer: BC Managed Care – PPO | Admitting: Cardiovascular Disease

## 2022-10-27 ENCOUNTER — Other Ambulatory Visit: Payer: Self-pay

## 2022-10-30 ENCOUNTER — Other Ambulatory Visit: Payer: Self-pay

## 2022-11-19 ENCOUNTER — Other Ambulatory Visit: Payer: Self-pay

## 2022-11-20 NOTE — Progress Notes (Incomplete)
Advanced Hypertension Clinic Initial Assessment:    Date:  11/20/2022   ID:  Luis Harrington, DOB 1966-07-27, MRN [id number]  PCP:  Dorna Mai, MD  Cardiologist:  Janina Mayo, MD  Nephrologist:  Referring MD: Janina Mayo, MD   CC: Hypertension  History of Present Illness:    Luis Harrington is a 57 y.o. male with a hx of hypertension, hyperlipidemia, tobacco abuse, here to establish care in the Advanced Hypertension Clinic.   He saw Dr. Harl Bowie 08/2022 as a referral for hypertension. His blood pressure was 248/143 when in the ED with a gout flare. When he saw Dr. Harl Bowie his blood pressure was 189/119. At the time he was taking losartan, HCTZ, and amlodipine. She added carvedilol and ordered renal dopplers that were normal 08/2022.  Today,  He denies any palpitations, chest pain, shortness of breath, or peripheral edema. No lightheadedness, headaches, syncope, orthopnea, or PND.  (+)  ***Plan: -  Previous antihypertensives:   Past Medical History:  Diagnosis Date   Gout    Hypertension     Past Surgical History:  Procedure Laterality Date   TENDON REPAIR     Finger    Current Medications: No outpatient medications have been marked as taking for the 11/23/22 encounter (Appointment) with Skeet Latch, MD.     Allergies:   Shrimp [shellfish allergy]   Social History   Socioeconomic History   Marital status: Married    Spouse name: Not on file   Number of children: Not on file   Years of education: Not on file   Highest education level: Not on file  Occupational History   Not on file  Tobacco Use   Smoking status: Some Days    Types: Cigarettes    Passive exposure: Current   Smokeless tobacco: Never  Vaping Use   Vaping Use: Never used  Substance and Sexual Activity   Alcohol use: Yes   Drug use: No   Sexual activity: Yes    Partners: Female  Other Topics Concern   Not on file  Social History Narrative   Not on file   Social  Determinants of Health   Financial Resource Strain: Not on file  Food Insecurity: Not on file  Transportation Needs: Not on file  Physical Activity: Not on file  Stress: Not on file  Social Connections: Not on file     Family History: The patient's family history includes Diabetes in his father and mother; Hypertension in his father and mother.  ROS:   Please see the history of present illness.     All other systems reviewed and are negative.  EKGs/Labs/Other Studies Reviewed:    Bilateral Renal Artery Dopplers  09/02/2022: Summary:  Renal:    Right: No evidence of right renal artery stenosis. RRV flow present.         Normal size right kidney. Normal right Resisitive Index.         Normal cortical thickness of right kidney.  Left:  No evidence of left renal artery stenosis. LRV flow present.         Normal size of left kidney. Normal left Resistive Index.         Normal cortical thickness of the left kidney.  Mesenteric:  Normal Celiac artery and Superior Mesenteric artery findings.   CTA Chest  07/12/2022: IMPRESSION: Positive examination for pulmonary embolism with segmental to subsegmental embolus isolated to the apical segment of the right upper lobe.  Aortic Atherosclerosis (ICD10-I70.0).  EKG:  EKG is personally reviewed. 11/23/2022: Sinus ***. Rate *** bpm.  Recent Labs: 07/12/2022: ALT 22; BUN 10; Creatinine, Ser 1.61; Hemoglobin 14.3; Platelets 256; Potassium 4.4; Sodium 139   Recent Lipid Panel    Component Value Date/Time   CHOL 231 (H) 12/30/2020 1048   TRIG 149 12/30/2020 1048   HDL 36 (L) 12/30/2020 1048   CHOLHDL 6.4 (H) 12/30/2020 1048   CHOLHDL 6 04/06/2018 0855   VLDL 27.8 04/06/2018 0855   LDLCALC 168 (H) 12/30/2020 1048    Physical Exam:    VS:  There were no vitals taken for this visit. , BMI There is no height or weight on file to calculate BMI. GENERAL:  Well appearing HEENT: Pupils equal round and reactive, fundi not visualized,  oral mucosa unremarkable NECK:  No jugular venous distention, waveform within normal limits, carotid upstroke brisk and symmetric, no bruits, no thyromegaly LYMPHATICS:  No cervical adenopathy LUNGS:  Clear to auscultation bilaterally HEART:  RRR.  PMI not displaced or sustained,S1 and S2 within normal limits, no S3, no S4, no clicks, no rubs, *** murmurs ABD:  Flat, positive bowel sounds normal in frequency in pitch, no bruits, no rebound, no guarding, no midline pulsatile mass, no hepatomegaly, no splenomegaly EXT:  2 plus pulses throughout, no edema, no cyanosis, no clubbing SKIN:  No rashes, no nodules NEURO:  Cranial nerves II through XII grossly intact, motor grossly intact throughout PSYCH:  Cognitively intact, oriented to person place and time   ASSESSMENT/PLAN:    No problem-specific Assessment & Plan notes found for this encounter.   Screening for Secondary Hypertension: { Click here to document screening for secondary causes of HTN  :[id number]}    Relevant Labs/Studies:    Latest Ref Rng & Units 07/12/2022    8:50 AM 12/30/2020   10:48 AM 04/06/2018    8:55 AM  Basic Labs  Sodium 135 - 145 mmol/L 139  136  137   Potassium 3.5 - 5.1 mmol/L 4.4  4.8  4.6   Creatinine 0.61 - 1.24 mg/dL 1.61  1.33  1.04        Latest Ref Rng & Units 12/30/2020   10:48 AM 04/06/2018    8:55 AM  Thyroid   TSH 0.450 - 4.500 uIU/mL 2.200  1.30                 09/02/2022   12:09 PM  Renovascular   Renal Artery [place] Completed Yes        he consents to be monitored in our remote patient monitoring program through [place].  he will track his blood pressure twice daily and understands that these trends will help [place] to adjust his medications as needed prior to his next appointment.  he *** interested in enrolling in the PREP exercise and nutrition program through the [facility].     Disposition:   *** FU with APP/PharmD in 1 month for the next 3 months.   FU with Tiffany C. Oval Linsey, MD, [facility] in 4  months.  Medication Adjustments/Labs and Tests Ordered: Current medicines are reviewed at length with the patient today.  Concerns regarding medicines are outlined above.   No orders of the defined types were placed in this encounter.  No orders of the defined types were placed in this encounter.  I,Mathew Stumpf,acting as a Education administrator for Skeet Latch, MD.,have documented all relevant documentation on the behalf of Skeet Latch, MD,as directed by  Skeet Latch, MD while in the presence of  Chilton Si, MD.  ***  Signed, Carlena Bjornstad  11/20/2022 4:04 PM    [facility]

## 2022-11-23 ENCOUNTER — Ambulatory Visit (HOSPITAL_BASED_OUTPATIENT_CLINIC_OR_DEPARTMENT_OTHER): Payer: BC Managed Care – PPO | Admitting: Cardiovascular Disease

## 2022-11-25 ENCOUNTER — Encounter: Payer: Self-pay | Admitting: Family Medicine

## 2022-11-25 ENCOUNTER — Other Ambulatory Visit: Payer: Self-pay | Admitting: Physician Assistant

## 2022-11-25 ENCOUNTER — Ambulatory Visit: Payer: PRIVATE HEALTH INSURANCE | Admitting: Family

## 2022-11-25 ENCOUNTER — Ambulatory Visit (INDEPENDENT_AMBULATORY_CARE_PROVIDER_SITE_OTHER): Payer: BC Managed Care – PPO | Admitting: Family Medicine

## 2022-11-25 ENCOUNTER — Other Ambulatory Visit: Payer: Self-pay | Admitting: Family Medicine

## 2022-11-25 VITALS — BP 121/81 | HR 86 | Temp 98.1°F | Resp 16 | Wt 243.6 lb

## 2022-11-25 DIAGNOSIS — Z7901 Long term (current) use of anticoagulants: Secondary | ICD-10-CM

## 2022-11-25 DIAGNOSIS — Z87898 Personal history of other specified conditions: Secondary | ICD-10-CM

## 2022-11-25 DIAGNOSIS — B349 Viral infection, unspecified: Secondary | ICD-10-CM | POA: Diagnosis not present

## 2022-11-25 DIAGNOSIS — I2699 Other pulmonary embolism without acute cor pulmonale: Secondary | ICD-10-CM

## 2022-11-25 DIAGNOSIS — I10 Essential (primary) hypertension: Secondary | ICD-10-CM | POA: Diagnosis not present

## 2022-11-25 DIAGNOSIS — Z87891 Personal history of nicotine dependence: Secondary | ICD-10-CM

## 2022-11-25 MED ORDER — LOSARTAN POTASSIUM 100 MG PO TABS: 100.0000 mg | ORAL_TABLET | Freq: Every day | ORAL | 0 refills | Status: DC

## 2022-11-25 NOTE — Telephone Encounter (Signed)
Requested medication (s) are due for refill today: For review  Requested medication (s) are on the active medication list: yes    Last refill:   Future visit scheduled yes today  Notes to clinic: Pt has appt today, please review if refill appropriate. apixaban (ELIQUIS) 5 MG TABS tablet  Take 1 tablet (5 mg total) by mouth 2 (two) times daily.  Patient taking differently: Take 5 mg by mouth daily.  Dispense: 60 tablet     Refills: 2     Start: 07/15/2022      By: Argentina Donovan, PA-C      Encounter  Notes to pharmacy: Start after you complete starter pack    Requested Prescriptions  Pending Prescriptions Disp Refills   apixaban (ELIQUIS) 5 MG TABS tablet 60 tablet 2    Sig: Take 1 tablet (5 mg total) by mouth 2 (two) times daily.     Hematology:  Anticoagulants - apixaban Failed - 11/25/2022  6:33 AM      Failed - Cr in normal range and within 360 days    Creatinine, Ser  Date Value Ref Range Status  07/12/2022 1.61 (H) 0.61 - 1.24 mg/dL Final         Passed - PLT in normal range and within 360 days    Platelets  Date Value Ref Range Status  07/12/2022 256 150 - 400 K/uL Final  12/30/2020 323 150 - 450 x10E3/uL Final         Passed - HGB in normal range and within 360 days    Hemoglobin  Date Value Ref Range Status  07/12/2022 14.3 13.0 - 17.0 g/dL Final  12/30/2020 15.5 13.0 - 17.7 g/dL Final         Passed - HCT in normal range and within 360 days    HCT  Date Value Ref Range Status  07/12/2022 40.8 39.0 - 52.0 % Final   Hematocrit  Date Value Ref Range Status  12/30/2020 45.6 37.5 - 51.0 % Final         Passed - AST in normal range and within 360 days    AST  Date Value Ref Range Status  07/12/2022 27 15 - 41 U/L Final         Passed - ALT in normal range and within 360 days    ALT  Date Value Ref Range Status  07/12/2022 22 0 - 44 U/L Final         Passed - Valid encounter within last 12 months    Recent Outpatient Visits           3 months  ago Uncontrolled hypertension   [facility] [place] [facility], MD   3 months ago Hypertensive urgency   [facility] [facility], Amy J, NP   4 months ago Essential hypertension   [facility], [place], [place]   12 months ago Essential (primary) hypertension   [place] [facility], [place], NP   1 year ago Encounter to establish care   [place] [facility], Flonnie Hail[place], NP       Future Appointments             In 1 month Skeet Latch, MD [facility] [facility], [place]

## 2022-11-26 ENCOUNTER — Encounter: Payer: Self-pay | Admitting: Family Medicine

## 2022-11-26 NOTE — Progress Notes (Signed)
Established Patient Office Visit  Subjective    Patient ID: Luis Harrington, male    DOB: July 12, 1966  Age: 57 y.o. MRN: [id number]  CC:  Chief Complaint  Patient presents with   Chills    HPI Luis Harrington presents with complaint of viral sx. Patient denies known contacts or exposures.    Outpatient Encounter Medications as of 11/25/2022  Medication Sig   acetaminophen-codeine (TYLENOL #3) 300-30 MG tablet Take 1 tablet by mouth every 4 (four) hours as needed for moderate pain.   amLODipine (NORVASC) 10 MG tablet Take 1 tablet (10 mg total) by mouth daily.   apixaban (ELIQUIS) 5 MG TABS tablet Take 1 tablet (5 mg total) by mouth 2 (two) times daily. (Patient taking differently: Take 5 mg by mouth daily.)   atorvastatin (LIPITOR) 20 MG tablet TAKE 1 TABLET(20 MG) BY MOUTH DAILY   carvedilol (COREG) 25 MG tablet Take 1 tablet (25 mg total) by mouth 2 (two) times daily.   hydrochlorothiazide (HYDRODIURIL) 25 MG tablet Take 1 tablet (25 mg total) by mouth daily.   tadalafil (CIALIS) 10 MG tablet Take 1 tablet 1/2 hour to 1 hour prior to intercourse as needed. Limit use to 1/2 tablet or 1 tablet per 24 hours.   allopurinol (ZYLOPRIM) 100 MG tablet Take 100 mg by mouth daily. (Patient not taking: Reported on 11/25/2022)   [DISCONTINUED] losartan (COZAAR) 100 MG tablet Take 1 tablet (100 mg total) by mouth daily. (Patient not taking: Reported on 11/25/2022)   No facility-administered encounter medications on file as of 11/25/2022.    Past Medical History:  Diagnosis Date   Gout    Hypertension     Past Surgical History:  Procedure Laterality Date   TENDON REPAIR     Finger    Family History  Problem Relation Age of Onset   Diabetes Mother    Hypertension Mother    Diabetes Father    Hypertension Father     Social History   Socioeconomic History   Marital status: Married    Spouse name: Not on file   Number of children: Not on file   Years of education: Not on file    Highest education level: Not on file  Occupational History   Not on file  Tobacco Use   Smoking status: Some Days    Types: Cigarettes    Passive exposure: Current   Smokeless tobacco: Never  Vaping Use   Vaping Use: Never used  Substance and Sexual Activity   Alcohol use: Yes   Drug use: No   Sexual activity: Yes    Partners: Female  Other Topics Concern   Not on file  Social History Narrative   Not on file   Social Determinants of Health   Financial Resource Strain: Not on file  Food Insecurity: Not on file  Transportation Needs: Not on file  Physical Activity: Not on file  Stress: Not on file  Social Connections: Not on file  Intimate Partner Violence: Not on file    Review of Systems  Constitutional:  Positive for chills and fever.  All other systems reviewed and are negative.       Objective    BP 121/81   Pulse 86   Temp 98.1 F (36.7 C) (Oral)   Resp 16   Wt 243 lb 9.6 oz (110.5 kg)   SpO2 96%   BMI 33.98 kg/m   Physical Exam Vitals and nursing note reviewed.  Constitutional:  General: He is not in acute distress. Cardiovascular:     Rate and Rhythm: Normal rate and regular rhythm.  Pulmonary:     Effort: Pulmonary effort is normal.     Breath sounds: Normal breath sounds.  Abdominal:     Palpations: Abdomen is soft.     Tenderness: There is no abdominal tenderness.  Neurological:     General: No focal deficit present.     Mental Status: He is alert and oriented to person, place, and time.         Assessment & Plan:   1. Essential hypertension Appears stable. Continue   2. Viral syndrome Conservative management recommended  3. Chronic anticoagulation Continue. Management as per consultant  4. Stopped smoking between 3 and 6 months ago   5. Stopped drinking alcohol     Return for follow up.   Becky Sax, MD

## 2022-11-27 MED ORDER — APIXABAN 5 MG PO TABS
5.0000 mg | ORAL_TABLET | Freq: Two times a day (BID) | ORAL | 2 refills | Status: DC
  Filled 2022-11-27 – 2022-12-01 (×2): qty 60, 30d supply, fill #0
  Filled 2022-12-02 – 2023-01-10 (×3): qty 60, 30d supply, fill #1
  Filled 2023-02-13 – 2023-05-02 (×2): qty 60, 30d supply, fill #2
  Filled 2023-05-13: qty 60, 30d supply, fill #0

## 2022-11-30 ENCOUNTER — Other Ambulatory Visit: Payer: Self-pay | Admitting: Family Medicine

## 2022-11-30 ENCOUNTER — Other Ambulatory Visit (HOSPITAL_COMMUNITY): Payer: Self-pay

## 2022-11-30 DIAGNOSIS — N529 Male erectile dysfunction, unspecified: Secondary | ICD-10-CM

## 2022-11-30 NOTE — Telephone Encounter (Unsigned)
Copied from [place] [id number]. Topic: General - Other >> Nov 30, 2022 11:38 AM Everette C wrote: Reason for CRM: Medication Refill - Medication: tadalafil (CIALIS) 10 MG tablet [[id number]]  Has the patient contacted their pharmacy? Yes.   (Agent: If no, request that the patient contact the pharmacy for the refill. If patient does not wish to contact the pharmacy document the reason why and proceed with request.) (Agent: If yes, when and what did the pharmacy advise?)  Preferred Pharmacy (with phone number or street name): [facility] ([address]), [place] - [address] [place]) [place] [address] Phone: [phone] Fax: [phone] Hours: Not open 24 hours   Has the patient been seen for an appointment in the last year OR does the patient have an upcoming appointment? Yes.    Agent: Please be advised that RX refills may take up to 3 business days. We ask that you follow-up with your pharmacy.  The patient would like to be contacted by a member of clinical staff if/when their prescription is refilled

## 2022-12-01 ENCOUNTER — Other Ambulatory Visit (HOSPITAL_COMMUNITY): Payer: Self-pay

## 2022-12-01 MED ORDER — TADALAFIL 10 MG PO TABS: ORAL_TABLET | ORAL | 1 refills | Status: DC

## 2022-12-01 NOTE — Telephone Encounter (Signed)
Requested Prescriptions  Pending Prescriptions Disp Refills   tadalafil (CIALIS) 10 MG tablet 30 tablet 1    Sig: Take 1 tablet 1/2 hour to 1 hour prior to intercourse as needed. Limit use to 1/2 tablet or 1 tablet per 24 hours.     Urology: Erectile Dysfunction Agents Passed - 11/30/2022 12:06 PM      Passed - AST in normal range and within 360 days    AST  Date Value Ref Range Status  07/12/2022 27 15 - 41 U/L Final         Passed - ALT in normal range and within 360 days    ALT  Date Value Ref Range Status  07/12/2022 22 0 - 44 U/L Final         Passed - Last BP in normal range    BP Readings from Last 1 Encounters:  11/25/22 121/81         Passed - Valid encounter within last 12 months    Recent Outpatient Visits           6 days ago Essential hypertension   [facility] [facility], MD   3 months ago Uncontrolled hypertension   [place] [facility], MD   3 months ago Hypertensive urgency   [facility] [facility], Amy J, NP   4 months ago Essential hypertension   [place] [facility], Dionne Bucy, [place]   1 year ago Essential (primary) hypertension   [place] [facility], Flonnie Hailstone, NP       Future Appointments             In 1 month Dorna Mai, MD [facility]   In 1 month Skeet Latch, MD [facility], [place]

## 2022-12-02 ENCOUNTER — Other Ambulatory Visit: Payer: Self-pay | Admitting: Family Medicine

## 2022-12-03 ENCOUNTER — Other Ambulatory Visit: Payer: Self-pay

## 2022-12-03 MED ORDER — ALLOPURINOL 100 MG PO TABS
100.0000 mg | ORAL_TABLET | Freq: Every day | ORAL | 1 refills | Status: DC
  Filled 2022-12-03 – 2023-01-10 (×5): qty 30, 30d supply, fill #0
  Filled 2023-02-13 – 2023-05-13 (×3): qty 30, 30d supply, fill #1

## 2022-12-11 ENCOUNTER — Other Ambulatory Visit (HOSPITAL_COMMUNITY): Payer: Self-pay

## 2022-12-16 ENCOUNTER — Other Ambulatory Visit (HOSPITAL_COMMUNITY): Payer: Self-pay

## 2022-12-28 ENCOUNTER — Other Ambulatory Visit: Payer: Self-pay

## 2022-12-28 ENCOUNTER — Other Ambulatory Visit (HOSPITAL_COMMUNITY): Payer: Self-pay

## 2022-12-28 ENCOUNTER — Encounter: Payer: Self-pay | Admitting: Pharmacist

## 2022-12-31 ENCOUNTER — Other Ambulatory Visit: Payer: Self-pay

## 2023-01-10 ENCOUNTER — Other Ambulatory Visit: Payer: Self-pay | Admitting: Family Medicine

## 2023-01-11 ENCOUNTER — Other Ambulatory Visit: Payer: Self-pay

## 2023-01-11 ENCOUNTER — Other Ambulatory Visit (HOSPITAL_COMMUNITY): Payer: Self-pay

## 2023-01-11 MED ORDER — LOSARTAN POTASSIUM 100 MG PO TABS: 100.0000 mg | ORAL_TABLET | Freq: Every day | ORAL | 0 refills | Status: DC

## 2023-01-11 MED ORDER — AMLODIPINE BESYLATE 10 MG PO TABS: 10.0000 mg | ORAL_TABLET | Freq: Every day | ORAL | 0 refills | Status: DC

## 2023-01-13 ENCOUNTER — Other Ambulatory Visit: Payer: Self-pay

## 2023-01-13 ENCOUNTER — Other Ambulatory Visit: Payer: Self-pay | Admitting: Family Medicine

## 2023-01-13 ENCOUNTER — Other Ambulatory Visit (HOSPITAL_COMMUNITY): Payer: Self-pay

## 2023-01-13 ENCOUNTER — Ambulatory Visit (HOSPITAL_BASED_OUTPATIENT_CLINIC_OR_DEPARTMENT_OTHER): Payer: BC Managed Care – PPO | Admitting: Cardiovascular Disease

## 2023-01-13 ENCOUNTER — Other Ambulatory Visit (HOSPITAL_COMMUNITY)
Admission: RE | Admit: 2023-01-13 | Discharge: 2023-01-13 | Disposition: A | Discharge status: Home / Self Care | Payer: BC Managed Care – PPO | Source: Ambulatory Visit | Attending: Family Medicine | Admitting: Family Medicine

## 2023-01-13 ENCOUNTER — Ambulatory Visit: Payer: BC Managed Care – PPO | Admitting: Family Medicine

## 2023-01-13 VITALS — BP 159/111 | HR 76 | Temp 98.1°F | Resp 16 | Wt 248.0 lb

## 2023-01-13 DIAGNOSIS — Z8739 Personal history of other diseases of the musculoskeletal system and connective tissue: Secondary | ICD-10-CM | POA: Diagnosis not present

## 2023-01-13 DIAGNOSIS — I10 Essential (primary) hypertension: Secondary | ICD-10-CM | POA: Diagnosis not present

## 2023-01-13 DIAGNOSIS — E785 Hyperlipidemia, unspecified: Secondary | ICD-10-CM | POA: Diagnosis not present

## 2023-01-13 DIAGNOSIS — Z113 Encounter for screening for infections with a predominantly sexual mode of transmission: Secondary | ICD-10-CM | POA: Diagnosis not present

## 2023-01-13 DIAGNOSIS — IMO0001 Reserved for inherently not codable concepts without codable children: Secondary | ICD-10-CM

## 2023-01-13 DIAGNOSIS — F17200 Nicotine dependence, unspecified, uncomplicated: Secondary | ICD-10-CM

## 2023-01-13 DIAGNOSIS — Z1211 Encounter for screening for malignant neoplasm of colon: Secondary | ICD-10-CM

## 2023-01-13 DIAGNOSIS — Z91148 Patient's other noncompliance with medication regimen for other reason: Secondary | ICD-10-CM

## 2023-01-13 DIAGNOSIS — Z711 Person with feared health complaint in whom no diagnosis is made: Secondary | ICD-10-CM

## 2023-01-13 MED ORDER — AMLODIPINE BESYLATE 10 MG PO TABS: 10.0000 mg | ORAL_TABLET | Freq: Every day | ORAL | 0 refills | Status: DC

## 2023-01-13 MED ORDER — ATORVASTATIN CALCIUM 20 MG PO TABS: ORAL_TABLET | ORAL | 2 refills | Status: AC

## 2023-01-13 MED ORDER — CARVEDILOL 25 MG PO TABS: 25.0000 mg | ORAL_TABLET | Freq: Two times a day (BID) | ORAL | 0 refills | Status: DC

## 2023-01-13 NOTE — Progress Notes (Signed)
Established Patient Office Visit  Subjective    Patient ID: Luis Harrington, male    DOB: 11-07-1965  Age: 57 y.o. MRN: [id number]  CC:  Chief Complaint  Patient presents with   Follow-up   Hypertension    HPI Luis Harrington presents for routine follow up of chronic med issues. Patient reports that he has not really understood which meds he should or should not have been taking.    Outpatient Encounter Medications as of 01/13/2023  Medication Sig   apixaban (ELIQUIS) 5 MG TABS tablet Take 1 tablet (5 mg total) by mouth 2 (two) times daily.   hydrochlorothiazide (HYDRODIURIL) 25 MG tablet Take 1 tablet (25 mg total) by mouth daily.   losartan (COZAAR) 100 MG tablet Take 1 tablet (100 mg total) by mouth daily.   tadalafil (CIALIS) 10 MG tablet Take 1 tablet 1/2 hour to 1 hour prior to intercourse as needed. Limit use to 1/2 tablet or 1 tablet per 24 hours.   acetaminophen-codeine (TYLENOL #3) 300-30 MG tablet Take 1 tablet by mouth every 4 (four) hours as needed for moderate pain. (Patient not taking: Reported on 01/13/2023)   allopurinol (ZYLOPRIM) 100 MG tablet Take 1 tablet (100 mg total) by mouth daily. (Patient not taking: Reported on 01/13/2023)   amLODipine (NORVASC) 10 MG tablet Take 1 tablet (10 mg total) by mouth daily.   atorvastatin (LIPITOR) 20 MG tablet TAKE 1 TABLET(20 MG) BY MOUTH DAILY   carvedilol (COREG) 25 MG tablet Take 1 tablet (25 mg total) by mouth 2 (two) times daily.   [DISCONTINUED] amLODipine (NORVASC) 10 MG tablet Take 1 tablet (10 mg total) by mouth daily. (Patient not taking: Reported on 01/13/2023)   [DISCONTINUED] atorvastatin (LIPITOR) 20 MG tablet TAKE 1 TABLET(20 MG) BY MOUTH DAILY (Patient not taking: Reported on 01/13/2023)   [DISCONTINUED] carvedilol (COREG) 25 MG tablet Take 1 tablet (25 mg total) by mouth 2 (two) times daily. (Patient not taking: Reported on 01/13/2023)   No facility-administered encounter medications on file as of 01/13/2023.     Past Medical History:  Diagnosis Date   Gout    Hypertension     Past Surgical History:  Procedure Laterality Date   TENDON REPAIR     Finger    Family History  Problem Relation Age of Onset   Diabetes Mother    Hypertension Mother    Diabetes Father    Hypertension Father     Social History   Socioeconomic History   Marital status: Married    Spouse name: Not on file   Number of children: Not on file   Years of education: Not on file   Highest education level: Not on file  Occupational History   Not on file  Tobacco Use   Smoking status: Some Days    Types: Cigarettes    Passive exposure: Current   Smokeless tobacco: Never  Vaping Use   Vaping Use: Never used  Substance and Sexual Activity   Alcohol use: Yes   Drug use: No   Sexual activity: Yes    Partners: Female  Other Topics Concern   Not on file  Social History Narrative   Not on file   Social Determinants of Health   Financial Resource Strain: Not on file  Food Insecurity: Not on file  Transportation Needs: Not on file  Physical Activity: Not on file  Stress: Not on file  Social Connections: Not on file  Intimate Partner Violence: Not on file  Review of Systems  All other systems reviewed and are negative.       Objective    BP (!) 159/111   Pulse 76   Temp 98.1 F (36.7 C) (Oral)   Resp 16   Wt 248 lb (112.5 kg)   SpO2 98%   BMI 34.59 kg/m   Physical Exam Vitals and nursing note reviewed.  Constitutional:      General: He is not in acute distress. Cardiovascular:     Rate and Rhythm: Normal rate and regular rhythm.  Pulmonary:     Effort: Pulmonary effort is normal.     Breath sounds: Normal breath sounds.  Abdominal:     Palpations: Abdomen is soft.     Tenderness: There is no abdominal tenderness.  Neurological:     General: No focal deficit present.     Mental Status: He is alert and oriented to person, place, and time.         Assessment & Plan:     1. Uncontrolled hypertension Elevated readings. Discussed compliance. Meds refilled. Continue   2. Dyslipidemia Continue  - atorvastatin (LIPITOR) 20 MG tablet; TAKE 1 TABLET(20 MG) BY MOUTH DAILY  Dispense: 30 tablet; Refill: 2  3. History of gout Patient will d/c allopurinol at this time. Cholchicine for acute episodes online  4. Smoking Discussed reduction/cessation.   5. Nonadherence to medication   6. Screening for colon cancer  - Cologuard  7. Concern about STD in male without diagnosis  - Urine cytology ancillary only   Return in about 4 weeks (around 02/10/2023) for follow up.   Becky Sax, MD

## 2023-01-13 NOTE — Progress Notes (Incomplete)
Advanced [facility]pertension [facility] Initial Assessment:    Date:  01/13/2023   ID:  Luis Harrington, DOB 07/21/1966, MRN [id number]  PCP:  Dorna Mai, MD  Cardiologist:  Janina Mayo, MD  Nephrologist:  Referring MD: Janina Mayo, MD   CC: Hypertension  History of Present Illness:    Luis Harrington is a 57 y.o. male with a hx of hypertension, *** here to establish care in the [facility].   Today,  *** denies any palpitations, chest pain, shortness of breath, or peripheral edema. No lightheadedness, headaches, syncope, orthopnea, or PND.  (+)  ***Plan: -  Previous antihypertensives:   Past Medical History:  Diagnosis Date   Gout    Hypertension     Past Surgical History:  Procedure Laterality Date   TENDON REPAIR     Finger    Current Medications: No outpatient medications have been marked as taking for the 01/13/23 encounter (Appointment) with Skeet Latch, MD.     Allergies:   Shrimp [shellfish allergy]   Social History   Socioeconomic History   Marital status: Married    Spouse name: Not on file   Number of children: Not on file   Years of education: Not on file   Highest education level: Not on file  Occupational History   Not on file  Tobacco Use   Smoking status: Some Days    Types: Cigarettes    Passive exposure: Current   Smokeless tobacco: Never  Vaping Use   Vaping Use: Never used  Substance and Sexual Activity   Alcohol use: Yes   Drug use: No   Sexual activity: Yes    Partners: Female  Other Topics Concern   Not on file  Social History Narrative   Not on file   Social Determinants of Health   Financial Resource Strain: Not on file  Food Insecurity: Not on file  Transportation Needs: Not on file  Physical Activity: Not on file  Stress: Not on file  Social Connections: Not on file     Family History: The patient's family history includes Diabetes in his father and mother; Hypertension in his father  and mother.  ROS:   Please see the history of present illness.     All other systems reviewed and are negative.  EKGs/Labs/Other Studies Reviewed:    ***  EKG:  EKG is personally reviewed. : Sinus ***. Rate *** bpm.  Recent Labs: 07/12/2022: ALT 22; BUN 10; Creatinine, Ser 1.61; Hemoglobin 14.3; Platelets 256; Potassium 4.4; Sodium 139   Recent Lipid Panel    Component Value Date/Time   CHOL 231 (H) 12/30/2020 1048   TRIG 149 12/30/2020 1048   HDL 36 (L) 12/30/2020 1048   CHOLHDL 6.4 (H) 12/30/2020 1048   CHOLHDL 6 04/06/2018 0855   VLDL 27.8 04/06/2018 0855   LDLCALC 168 (H) 12/30/2020 1048    Physical Exam:    VS:  There were no vitals taken for this visit. , BMI There is no height or weight on file to calculate BMI. GENERAL:  Well appearing HEENT: Pupils equal round and reactive, fundi not visualized, oral mucosa unremarkable NECK:  No jugular venous distention, waveform within normal limits, carotid upstroke brisk and symmetric, no bruits, no thyromegaly LYMPHATICS:  No cervical adenopathy LUNGS:  Clear to auscultation bilaterally HEART:  RRR.  PMI not displaced or sustained,S1 and S2 within normal limits, no S3, no S4, no clicks, no rubs, *** murmurs ABD:  Flat, positive bowel sounds normal  in frequency in pitch, no bruits, no rebound, no guarding, no midline pulsatile mass, no hepatomegaly, no splenomegaly EXT:  2 plus pulses throughout, no edema, no cyanosis, no clubbing SKIN:  No rashes, no nodules NEURO:  Cranial nerves II through XII grossly intact, motor grossly intact throughout PSYCH:  Cognitively intact, oriented to person place and time   ASSESSMENT/PLAN:    No problem-specific Assessment & Plan notes found for this encounter.   Screening for Secondary Hypertension: { Click here to document screening for secondary causes of HTN  :[id number]    Relevant Labs/Studies:    Latest Ref Rng & Units 07/12/2022    8:50 AM 12/30/2020   10:48 AM 04/06/2018     8:55 AM  Basic Labs  Sodium 135 - 145 mmol/L 139  136  137   Potassium 3.5 - 5.1 mmol/L 4.4  4.8  4.6   Creatinine 0.61 - 1.24 mg/dL 1.61  1.33  1.04        Latest Ref Rng & Units 12/30/2020   10:48 AM 04/06/2018    8:55 AM  Thyroid   TSH 0.450 - 4.500 uIU/mL 2.200  1.30                 09/02/2022   12:09 PM  Renovascular   Renal Artery [place] Completed Yes        he consents to be monitored in our remote patient monitoring program through [facility].  he will track his blood pressure twice daily and understands that these trends will help [place] to adjust his medications as needed prior to his next appointment.  he *** interested in enrolling in the PREP exercise and nutrition program through the [facility].     Disposition:   *** FU with APP/PharmD in 1 month for the next 3 months.   FU with Tiffany C. Oval Linsey, MD, [facility] in 4 months.  Medication Adjustments/Labs and Tests Ordered: Current medicines are reviewed at length with the patient today.  Concerns regarding medicines are outlined above.   No orders of the defined types were placed in this encounter.  No orders of the defined types were placed in this encounter.   I,Mathew Stumpf,acting as a Education administrator for Skeet Latch, MD.,have documented all relevant documentation on the behalf of Skeet Latch, MD,as directed by  Skeet Latch, MD while in the presence of Skeet Latch, MD.  ***  Signed, Madelin Rear  01/13/2023 7:40 AM    [place]

## 2023-01-13 NOTE — Progress Notes (Signed)
Patient is here for their 3 month follow-up Patient has no concerns today Care gaps have been discussed with patient  

## 2023-01-14 LAB — URINE CYTOLOGY ANCILLARY ONLY
Chlamydia: NEGATIVE
Comment: NEGATIVE
Comment: NORMAL
Neisseria Gonorrhea: NEGATIVE

## 2023-01-19 ENCOUNTER — Encounter: Payer: Self-pay | Admitting: Family Medicine

## 2023-01-27 ENCOUNTER — Emergency Department (HOSPITAL_BASED_OUTPATIENT_CLINIC_OR_DEPARTMENT_OTHER)
Admission: EM | Admit: 2023-01-27 | Discharge: 2023-01-27 | Disposition: A | Discharge status: Home / Self Care | Payer: BC Managed Care – PPO | Attending: Emergency Medicine | Admitting: Emergency Medicine

## 2023-01-27 ENCOUNTER — Other Ambulatory Visit: Payer: Self-pay

## 2023-01-27 DIAGNOSIS — L03012 Cellulitis of left finger: Secondary | ICD-10-CM

## 2023-01-27 DIAGNOSIS — M79645 Pain in left finger(s): Secondary | ICD-10-CM | POA: Diagnosis not present

## 2023-01-27 MED ORDER — BACITRACIN ZINC 500 UNIT/GM EX OINT
TOPICAL_OINTMENT | Freq: Once | CUTANEOUS | Status: AC
  Filled 2023-01-27: qty 28.35

## 2023-01-27 MED ORDER — AMOXICILLIN-POT CLAVULANATE 875-125 MG PO TABS: 1.0000 | ORAL_TABLET | Freq: Two times a day (BID) | ORAL | 0 refills | Status: DC

## 2023-01-27 NOTE — ED Triage Notes (Signed)
Patient presents to ED via POV from home. Here with left middle finger pain and swelling. Area of yellow around nail bed.

## 2023-01-27 NOTE — ED Provider Notes (Signed)
[place] [facility] AT [facility] Provider Note   CSN: [id number] Arrival date & time: 01/27/23  1000     History  Chief Complaint  Patient presents with   Hand Pain    Luis Harrington is a 57 y.o. male with concerns for left middle finger pain and swelling onset 2 days. No meds tried at home. Denies color change, hand pain. No history of similar symptoms. NKDA. No history of DM.   The history is provided by the patient. No language interpreter was used.       Home Medications Prior to Admission medications   Medication Sig Start Date End Date Taking? Authorizing Provider  amoxicillin-clavulanate (AUGMENTIN) 875-125 MG tablet Take 1 tablet by mouth every 12 (twelve) hours. 01/27/23  Yes Antwaun Buth A, PA-C  acetaminophen-codeine (TYLENOL #3) 300-30 MG tablet Take 1 tablet by mouth every 4 (four) hours as needed for moderate pain. Patient not taking: Reported on 01/13/2023 07/15/22   Argentina Donovan, PA-C  allopurinol (ZYLOPRIM) 100 MG tablet Take 1 tablet (100 mg total) by mouth daily. Patient not taking: Reported on 01/13/2023 12/03/22   Dorna Mai, MD  amLODipine (NORVASC) 10 MG tablet Take 1 tablet (10 mg total) by mouth daily. 01/13/23   Dorna Mai, MD  apixaban (ELIQUIS) 5 MG TABS tablet Take 1 tablet (5 mg total) by mouth 2 (two) times daily. 11/27/22   Dorna Mai, MD  atorvastatin (LIPITOR) 20 MG tablet TAKE 1 TABLET(20 MG) BY MOUTH DAILY 01/13/23   Dorna Mai, MD  carvedilol (COREG) 25 MG tablet Take 1 tablet (25 mg total) by mouth 2 (two) times daily. 01/13/23   Dorna Mai, MD  hydrochlorothiazide (HYDRODIURIL) 25 MG tablet Take 1 tablet (25 mg total) by mouth daily. 08/13/22   Dorna Mai, MD  losartan (COZAAR) 100 MG tablet Take 1 tablet (100 mg total) by mouth daily. 01/11/23   Dorna Mai, MD  tadalafil (CIALIS) 10 MG tablet Take 1 tablet 1/2 hour to 1 hour prior to intercourse as needed. Limit use to 1/2 tablet or 1 tablet per 24  hours. 12/01/22   Dorna Mai, MD      Allergies    Shrimp [shellfish allergy]    Review of Systems   Review of Systems  All other systems reviewed and are negative.   Physical Exam Updated Vital Signs BP 112/71   Pulse 87   Temp 98 F (36.7 C) (Oral)   Resp 17   Ht 5\' 11"  (1.803 m)   Wt 108.9 kg   SpO2 100%   BMI 33.47 kg/m  Physical Exam Vitals and nursing note reviewed.  Constitutional:      General: He is not in acute distress.    Appearance: Normal appearance.  Eyes:     General: No scleral icterus.    Extraocular Movements: Extraocular movements intact.  Cardiovascular:     Rate and Rhythm: Normal rate.  Pulmonary:     Effort: Pulmonary effort is normal. No respiratory distress.  Abdominal:     Palpations: Abdomen is soft. There is no mass.     Tenderness: There is no abdominal tenderness.  Musculoskeletal:        General: Normal range of motion.     Cervical back: Neck supple.     Comments: Paronychia noted to left middle finger. No TTP noted to palmar aspect of left third distal phalanx. Full ROM of left third digit against resistance.   Skin:    General: Skin is  warm and dry.     Findings: No rash.  Neurological:     Mental Status: He is alert.     Sensory: Sensation is intact.     Motor: Motor function is intact.  Psychiatric:        Behavior: Behavior normal.     ED Results / Procedures / Treatments   Labs (all labs ordered are listed, but only abnormal results are displayed) Labs Reviewed - No data to display  EKG None  Radiology No results found.  Procedures Drain paronychia  Date/Time: 01/27/2023 10:33 AM  Performed by: Nehemiah Settle, PA-C Authorized by: Nehemiah Settle, PA-C  Consent: Verbal consent obtained. Risks and benefits: risks, benefits and alternatives were discussed Consent given by: patient Patient understanding: patient states understanding of the procedure being performed Patient identity confirmed: verbally with  patient and hospital-assigned identification number Local anesthesia used: no  Anesthesia: Local anesthesia used: no  Sedation: Patient sedated: no  Patient tolerance: patient tolerated the procedure well with no immediate complications       Medications Ordered in ED Medications  bacitracin ointment (has no administration in time range)    ED Course/ Medical Decision Making/ A&P                             Medical Decision Making  Pt presents with left middle finger pain onset 2 days. No history of DM. Vital signs, pt afebrile. On exam, pt with Paronychia noted to left middle finger. No TTP noted to palmar aspect of left third distal phalanx. Full ROM of left third digit against resistance. Differential diagnosis includes cellulitis, paronychia, felon.   Disposition: Presentation suspicious for paronychia. Doubt felon at this time. Doubt cellulitis at this time.. After consideration of the diagnostic results and the patients response to treatment, I feel that the patient would benefit from Discharge home. Work note provided. Augmentin Rx given. Instructed on warm soaks. Supportive care measures and strict return precautions discussed with patient at bedside. Pt acknowledges and verbalizes understanding. Pt appears safe for discharge. Follow up as indicated in discharge paperwork.    This chart was dictated using voice recognition software, Dragon. Despite the best efforts of this provider to proofread and correct errors, errors may still occur which can change documentation meaning.   Final Clinical Impression(s) / ED Diagnoses Final diagnoses:  Paronychia of finger of left hand    Rx / DC Orders ED Discharge Orders          Ordered    amoxicillin-clavulanate (AUGMENTIN) 875-125 MG tablet  Every 12 hours        01/27/23 1029              Brannan Cassedy A, PA-C 01/27/23 1034    Long, [facility], MD 01/27/23 1730

## 2023-01-27 NOTE — Discharge Instructions (Addendum)
It was a pleasure taking care of you today!  You will be sent with a prescription for augmentin, take as directed. Also start with warm soaks for up to 15 minutes at a time. You may follow up with your primary care provider as needed. Return to the ED if you are experiencing increasing/worsening symptoms.

## 2023-02-10 DIAGNOSIS — Z1211 Encounter for screening for malignant neoplasm of colon: Secondary | ICD-10-CM | POA: Diagnosis not present

## 2023-02-15 ENCOUNTER — Other Ambulatory Visit (HOSPITAL_COMMUNITY): Payer: Self-pay

## 2023-02-16 ENCOUNTER — Other Ambulatory Visit: Payer: Self-pay

## 2023-02-17 ENCOUNTER — Ambulatory Visit: Payer: PRIVATE HEALTH INSURANCE | Admitting: Family Medicine

## 2023-02-19 ENCOUNTER — Encounter: Payer: Self-pay | Admitting: *Deleted

## 2023-02-19 ENCOUNTER — Other Ambulatory Visit: Payer: Self-pay | Admitting: Family Medicine

## 2023-02-19 DIAGNOSIS — Z1211 Encounter for screening for malignant neoplasm of colon: Secondary | ICD-10-CM

## 2023-02-19 LAB — COLOGUARD: COLOGUARD: POSITIVE — AB

## 2023-02-22 ENCOUNTER — Other Ambulatory Visit (HOSPITAL_COMMUNITY): Payer: Self-pay

## 2023-02-25 ENCOUNTER — Institutional Professional Consult (permissible substitution) (HOSPITAL_BASED_OUTPATIENT_CLINIC_OR_DEPARTMENT_OTHER): Payer: BC Managed Care – PPO | Admitting: Family

## 2023-03-01 ENCOUNTER — Encounter: Payer: Self-pay | Admitting: Cardiovascular Disease

## 2023-05-08 ENCOUNTER — Other Ambulatory Visit (HOSPITAL_COMMUNITY): Payer: Self-pay

## 2023-05-13 ENCOUNTER — Other Ambulatory Visit: Payer: Self-pay

## 2023-06-12 ENCOUNTER — Encounter (HOSPITAL_COMMUNITY): Payer: Self-pay | Admitting: Emergency Medicine

## 2023-06-12 ENCOUNTER — Other Ambulatory Visit: Payer: Self-pay

## 2023-06-12 ENCOUNTER — Other Ambulatory Visit: Payer: Self-pay | Admitting: Family Medicine

## 2023-06-12 ENCOUNTER — Ambulatory Visit (HOSPITAL_COMMUNITY)
Admission: EM | Admit: 2023-06-12 | Discharge: 2023-06-12 | Disposition: A | Discharge status: Home / Self Care | Payer: BC Managed Care – PPO | Source: Home / Self Care

## 2023-06-12 DIAGNOSIS — I2699 Other pulmonary embolism without acute cor pulmonale: Secondary | ICD-10-CM

## 2023-06-12 DIAGNOSIS — M109 Gout, unspecified: Secondary | ICD-10-CM | POA: Diagnosis not present

## 2023-06-12 HISTORY — DX: Acute embolism and thrombosis of unspecified deep veins of unspecified lower extremity: I82.409

## 2023-06-12 HISTORY — DX: Other pulmonary embolism without acute cor pulmonale: I26.99

## 2023-06-12 MED ORDER — COLCHICINE 0.6 MG PO TABS: 0.6000 mg | ORAL_TABLET | Freq: Every day | ORAL | 0 refills | Status: DC

## 2023-06-12 MED ORDER — COLCHICINE 0.6 MG PO TABS: 0.6000 mg | ORAL_TABLET | Freq: Two times a day (BID) | ORAL | 0 refills | Status: AC

## 2023-06-12 NOTE — ED Triage Notes (Signed)
Pt here for gout flare that started 2 days ago. Out of his colchicine and would like refill.  Right foot swollen.  Difficulty with ambulation r/t pain.

## 2023-06-12 NOTE — Discharge Instructions (Addendum)
Take the colchicine twice daily until 48 hours following your flare.  This medication should not be taken with your carvedilol, if you are taking this please do not take it while you are receiving treatment with colchicine.  Please ensure you are minimizing your red meat and alcohol intake, as these foods are high in purine's and can lead to gout flares.  If you choose, please restart your allopurinol after this flare has resolved.  Follow-up with your primary care for further management of your gout and chronic healthcare conditions.

## 2023-06-12 NOTE — ED Provider Notes (Signed)
[facility]    CSN: [id number] Arrival date & time: 06/12/23  1129      History   Chief Complaint Chief Complaint  Patient presents with   Gout    HPI Luis Harrington is a 57 y.o. male.   Patient presents to clinic for complaint of right foot pain.  Reports it is a gout flare, has history of same.  He has not been taking his allopurinol.  Wednesday he got together with his brother and they had [place] steaks and liquor. Prior to this he was not drinking ETOH, this is a rare occasion for him.  He had two left over pills of colchicine, which he took and this helped some. Denies falls or trauma. Is walking on crutches.     The history is provided by the patient and medical records.    Past Medical History:  Diagnosis Date   DVT (deep venous thrombosis) (HCC)    Gout    Hypertension    Pulmonary embolism [facility])     Patient Active Problem List   Diagnosis Date Noted   History of gout 12/26/2020   Hyperglycemia 04/07/2018   Dyslipidemia 04/07/2018   Gout 04/06/2018   Essential hypertension 04/06/2018    Past Surgical History:  Procedure Laterality Date   TENDON REPAIR     Finger       Home Medications    Prior to Admission medications   Medication Sig Start Date End Date Taking? Authorizing Provider  allopurinol (ZYLOPRIM) 100 MG tablet Take 1 tablet (100 mg total) by mouth daily. 12/03/22  Yes Georganna Skeans, MD  amLODipine (NORVASC) 10 MG tablet Take 1 tablet (10 mg total) by mouth daily. 01/13/23   Georganna Skeans, MD  apixaban (ELIQUIS) 5 MG TABS tablet Take 1 tablet (5 mg total) by mouth 2 (two) times daily. 11/27/22   Georganna Skeans, MD  atorvastatin (LIPITOR) 20 MG tablet TAKE 1 TABLET(20 MG) BY MOUTH DAILY 01/13/23   Georganna Skeans, MD  carvedilol (COREG) 25 MG tablet Take 1 tablet (25 mg total) by mouth 2 (two) times daily. 01/13/23   Georganna Skeans, MD  colchicine 0.6 MG tablet Take 1 tablet (0.6 mg total) by mouth 2 (two) times daily. 06/12/23    Alizae Bechtel, [place] N, FNP  hydrochlorothiazide (HYDRODIURIL) 25 MG tablet Take 1 tablet (25 mg total) by mouth daily. 08/13/22   Georganna Skeans, MD  losartan (COZAAR) 100 MG tablet Take 1 tablet (100 mg total) by mouth daily. 01/11/23   Georganna Skeans, MD  tadalafil (CIALIS) 10 MG tablet Take 1 tablet 1/2 hour to 1 hour prior to intercourse as needed. Limit use to 1/2 tablet or 1 tablet per 24 hours. 12/01/22   Georganna Skeans, MD    Family History Family History  Problem Relation Age of Onset   Diabetes Mother    Hypertension Mother    Diabetes Father    Hypertension Father     Social History Social History   Tobacco Use   Smoking status: Some Days    Types: Cigarettes    Passive exposure: Current   Smokeless tobacco: Never  Vaping Use   Vaping status: Never Used  Substance Use Topics   Alcohol use: Yes   Drug use: No     Allergies   Shrimp [shellfish allergy]   Review of Systems Review of Systems  Constitutional:  Negative for fever.  Musculoskeletal:  Positive for joint swelling.     Physical Exam Triage Vital Signs ED Triage Vitals  Encounter Vitals Group     BP 06/12/23 1236 (!) 165/90     Systolic BP Percentile --      Diastolic BP Percentile --      Pulse Rate 06/12/23 1231 84     Resp 06/12/23 1231 18     Temp 06/12/23 1231 98.8 F (37.1 C)     Temp Source 06/12/23 1231 Oral     SpO2 06/12/23 1231 98 %     Weight 06/12/23 1232 240 lb (108.9 kg)     Height 06/12/23 1232 5\' 11"  (1.803 m)     Head Circumference --      Peak Flow --      Pain Score 06/12/23 1232 6     Pain Loc --      Pain Education --      Exclude from Growth Chart --    No data found.  Updated Vital Signs BP (!) 165/90   Pulse 84   Temp 98.8 F (37.1 C) (Oral)   Resp 18   Ht 5\' 11"  (1.803 m)   Wt 240 lb (108.9 kg)   SpO2 98%   BMI 33.47 kg/m   Visual Acuity Right Eye Distance:   Left Eye Distance:   Bilateral Distance:    Right Eye Near:   Left Eye Near:     Bilateral Near:     Physical Exam Vitals and nursing note reviewed.  Constitutional:      Appearance: Normal appearance.  HENT:     Head: Normocephalic and atraumatic.     Right Ear: External ear normal.     Left Ear: External ear normal.     Nose: Nose normal.     Mouth/Throat:     Mouth: Mucous membranes are moist.  Eyes:     Conjunctiva/sclera: Conjunctivae normal.  Cardiovascular:     Rate and Rhythm: Normal rate.     Pulses: Normal pulses.  Pulmonary:     Effort: Pulmonary effort is normal.  Musculoskeletal:        General: Swelling and tenderness present. No deformity or signs of injury. Normal range of motion.     Cervical back: Normal range of motion.  Skin:    General: Skin is warm and dry.     Capillary Refill: Capillary refill takes less than 2 seconds.     Findings: Erythema present.  Neurological:     General: No focal deficit present.     Mental Status: He is alert and oriented to person, place, and time.  Psychiatric:        Mood and Affect: Mood normal.        Behavior: Behavior normal.      UC Treatments / Results  Labs (all labs ordered are listed, but only abnormal results are displayed) Labs Reviewed - No data to display  EKG   Radiology No results found.  Procedures Procedures (including critical care time)  Medications Ordered in UC Medications - No data to display  Initial Impression / Assessment and Plan / UC Course  I have reviewed the triage vital signs and the nursing notes.  Pertinent labs & imaging results that were available during my care of the patient were reviewed by me and considered in my medical decision making (see chart for details).  Vitals and triage reviewed, patient is hemodynamically stable.  Right foot with generalized warmth, swelling and erythema.  Atraumatic.  Neurovascularly intact with brisk capillary refill and 2+ pedal pulses.  History of gout, recent  dietary triggers and is not taking allopurinol.  Will  treat with colchicine, PCP had discontinued his carvedilol prior, encouraged not to take this with colchicine.  Dietary modifications discussed.  Plan of care, follow-up care and return precautions given, no questions at this time.     Final Clinical Impressions(s) / UC Diagnoses   Final diagnoses:  Acute gout of right foot, unspecified cause     Discharge Instructions      Take the colchicine twice daily until 48 hours following your flare.  This medication should not be taken with your carvedilol, if you are taking this please do not take it while you are receiving treatment with colchicine.  Please ensure you are minimizing your red meat and alcohol intake, as these foods are high in purine's and can lead to gout flares.  If you choose, please restart your allopurinol after this flare has resolved.  Follow-up with your primary care for further management of your gout and chronic healthcare conditions.     ED Prescriptions     Medication Sig Dispense Auth. Provider   colchicine 0.6 MG tablet  (Status: Discontinued) Take 1 tablet (0.6 mg total) by mouth daily. 30 tablet Rinaldo Ratel, [place] N, [place]   colchicine 0.6 MG tablet  (Status: Discontinued) Take 1 tablet (0.6 mg total) by mouth daily. 30 tablet Rinaldo Ratel, [place] N, [place]   colchicine 0.6 MG tablet Take 1 tablet (0.6 mg total) by mouth 2 (two) times daily. 30 tablet [place], [place] N, [place]      PDMP not reviewed this encounter.   Renso Swett, [place] N, [place] 06/12/23 [phone]

## 2023-06-14 ENCOUNTER — Other Ambulatory Visit: Payer: Self-pay

## 2023-06-14 MED ORDER — ALLOPURINOL 100 MG PO TABS
100.0000 mg | ORAL_TABLET | Freq: Every day | ORAL | 0 refills | Status: DC
  Filled 2023-06-14 – 2023-06-23 (×2): qty 90, 90d supply, fill #0

## 2023-06-14 MED ORDER — APIXABAN 5 MG PO TABS
5.0000 mg | ORAL_TABLET | Freq: Two times a day (BID) | ORAL | 0 refills | Status: DC
Start: 2023-06-14 — End: 2023-07-05
  Filled 2023-06-14 – 2023-06-23 (×2): qty 180, 90d supply, fill #0

## 2023-06-14 NOTE — Telephone Encounter (Signed)
Requested Prescriptions  Pending Prescriptions Disp Refills   apixaban (ELIQUIS) 5 MG TABS tablet 180 tablet 0    Sig: Take 1 tablet (5 mg total) by mouth 2 (two) times daily.     Hematology:  Anticoagulants - apixaban Failed - 06/12/2023  2:18 AM      Failed - Cr in normal range and within 360 days    Creatinine, Ser  Date Value Ref Range Status  07/12/2022 1.61 (H) 0.61 - 1.24 mg/dL Final         Passed - PLT in normal range and within 360 days    Platelets  Date Value Ref Range Status  07/12/2022 256 150 - 400 K/uL Final  12/30/2020 323 150 - 450 x10E3/uL Final         Passed - HGB in normal range and within 360 days    Hemoglobin  Date Value Ref Range Status  07/12/2022 14.3 13.0 - 17.0 g/dL Final  65/78/4696 29.5 13.0 - 17.7 g/dL Final         Passed - HCT in normal range and within 360 days    HCT  Date Value Ref Range Status  07/12/2022 40.8 39.0 - 52.0 % Final   Hematocrit  Date Value Ref Range Status  12/30/2020 45.6 37.5 - 51.0 % Final         Passed - AST in normal range and within 360 days    AST  Date Value Ref Range Status  07/12/2022 27 15 - 41 U/L Final         Passed - ALT in normal range and within 360 days    ALT  Date Value Ref Range Status  07/12/2022 22 0 - 44 U/L Final         Passed - Valid encounter within last 12 months    Recent Outpatient Visits           5 months ago Uncontrolled hypertension   [place] [facility], MD   6 months ago Essential hypertension   [place] [facility], MD   10 months ago Uncontrolled hypertension   [place] [facility], MD   10 months ago Hypertensive urgency   [place] [facility], Amy J, NP   11 months ago Essential hypertension   [place] [facility], Marylene Land M, [place]               allopurinol (ZYLOPRIM) 100 MG tablet 90  tablet 0    Sig: Take 1 tablet (100 mg total) by mouth daily.     Endocrinology:  Gout Agents - allopurinol Failed - 06/12/2023  2:18 AM      Failed - Uric Acid in normal range and within 360 days    Uric Acid  Date Value Ref Range Status  12/30/2020 10.7 (H) 3.8 - 8.4 mg/dL Final    Comment:               Therapeutic target for gout patients: <6.0         Failed - Cr in normal range and within 360 days    Creatinine, Ser  Date Value Ref Range Status  07/12/2022 1.61 (H) 0.61 - 1.24 mg/dL Final         Failed - CBC within normal limits and completed in the last 12 months    WBC  Date Value Ref Range Status  07/12/2022 16.1 (H) 4.0 - 10.5 K/uL Final   RBC  Date Value Ref Range Status  07/12/2022 4.35 4.22 - 5.81 MIL/uL Final   Hemoglobin  Date Value Ref Range Status  07/12/2022 14.3 13.0 - 17.0 g/dL Final  16/07/9603 54.0 13.0 - 17.7 g/dL Final   HCT  Date Value Ref Range Status  07/12/2022 40.8 39.0 - 52.0 % Final   Hematocrit  Date Value Ref Range Status  12/30/2020 45.6 37.5 - 51.0 % Final   MCHC  Date Value Ref Range Status  07/12/2022 35.0 30.0 - 36.0 g/dL Final   [facility]  Date Value Ref Range Status  07/12/2022 32.9 26.0 - 34.0 pg Final   MCV  Date Value Ref Range Status  07/12/2022 93.8 80.0 - 100.0 fL Final  12/30/2020 90 79 - 97 fL Final   No results found for: "PLTCOUNTKUC", "LABPLAT", "POCPLA" RDW  Date Value Ref Range Status  07/12/2022 15.9 (H) 11.5 - 15.5 % Final  12/30/2020 15.8 (H) 11.6 - 15.4 % Final         Passed - Valid encounter within last 12 months    Recent Outpatient Visits           5 months ago Uncontrolled hypertension   [place] [facility], MD   6 months ago Essential hypertension   [place] [facility], MD   10 months ago Uncontrolled hypertension   [place] [facility], MD   10 months ago Hypertensive urgency    [place] [facility], Amy J, NP   11 months ago Essential hypertension   [facility], [place], [place]

## 2023-06-19 ENCOUNTER — Other Ambulatory Visit (HOSPITAL_COMMUNITY): Payer: Self-pay

## 2023-06-23 ENCOUNTER — Other Ambulatory Visit: Payer: Self-pay

## 2023-07-05 ENCOUNTER — Other Ambulatory Visit: Payer: Self-pay | Admitting: Family Medicine

## 2023-07-05 DIAGNOSIS — I2699 Other pulmonary embolism without acute cor pulmonale: Secondary | ICD-10-CM

## 2023-07-08 ENCOUNTER — Other Ambulatory Visit: Payer: Self-pay

## 2023-07-08 DIAGNOSIS — M9905 Segmental and somatic dysfunction of pelvic region: Secondary | ICD-10-CM | POA: Diagnosis not present

## 2023-07-08 DIAGNOSIS — M47817 Spondylosis without myelopathy or radiculopathy, lumbosacral region: Secondary | ICD-10-CM | POA: Diagnosis not present

## 2023-07-08 DIAGNOSIS — M9903 Segmental and somatic dysfunction of lumbar region: Secondary | ICD-10-CM | POA: Diagnosis not present

## 2023-07-08 DIAGNOSIS — M9904 Segmental and somatic dysfunction of sacral region: Secondary | ICD-10-CM | POA: Diagnosis not present

## 2023-07-08 MED ORDER — APIXABAN 5 MG PO TABS
5.0000 mg | ORAL_TABLET | Freq: Two times a day (BID) | ORAL | 0 refills | Status: DC
Start: 2023-07-08 — End: 2024-02-25
  Filled 2023-07-08 – 2024-02-12 (×5): qty 180, 90d supply, fill #0

## 2023-07-14 DIAGNOSIS — M47817 Spondylosis without myelopathy or radiculopathy, lumbosacral region: Secondary | ICD-10-CM | POA: Diagnosis not present

## 2023-07-14 DIAGNOSIS — M9905 Segmental and somatic dysfunction of pelvic region: Secondary | ICD-10-CM | POA: Diagnosis not present

## 2023-07-14 DIAGNOSIS — M9903 Segmental and somatic dysfunction of lumbar region: Secondary | ICD-10-CM | POA: Diagnosis not present

## 2023-07-14 DIAGNOSIS — M9904 Segmental and somatic dysfunction of sacral region: Secondary | ICD-10-CM | POA: Diagnosis not present

## 2023-08-16 ENCOUNTER — Encounter: Payer: BC Managed Care – PPO | Admitting: Family Medicine

## 2023-08-20 ENCOUNTER — Encounter: Payer: BC Managed Care – PPO | Admitting: Family Medicine

## 2023-09-25 ENCOUNTER — Other Ambulatory Visit: Payer: Self-pay

## 2023-09-25 ENCOUNTER — Other Ambulatory Visit: Payer: Self-pay | Admitting: Family Medicine

## 2023-09-27 ENCOUNTER — Other Ambulatory Visit: Payer: Self-pay

## 2023-10-06 ENCOUNTER — Other Ambulatory Visit: Payer: Self-pay

## 2023-10-07 ENCOUNTER — Other Ambulatory Visit: Payer: Self-pay

## 2023-11-01 ENCOUNTER — Other Ambulatory Visit: Payer: Self-pay | Admitting: Family Medicine

## 2023-11-01 ENCOUNTER — Other Ambulatory Visit: Payer: Self-pay

## 2023-11-01 NOTE — Telephone Encounter (Signed)
 Schedule appointment?

## 2023-11-11 ENCOUNTER — Other Ambulatory Visit: Payer: Self-pay

## 2023-11-18 ENCOUNTER — Ambulatory Visit: Payer: BC Managed Care – PPO | Attending: Internal Medicine | Admitting: Internal Medicine

## 2023-11-18 VITALS — BP 158/112 | HR 70 | Ht 71.0 in | Wt 252.0 lb

## 2023-11-18 DIAGNOSIS — I10 Essential (primary) hypertension: Secondary | ICD-10-CM

## 2023-11-18 MED ORDER — HYDROCHLOROTHIAZIDE 25 MG PO TABS: 25.0000 mg | ORAL_TABLET | Freq: Every day | ORAL | 3 refills | Status: DC

## 2023-11-18 MED ORDER — LOSARTAN POTASSIUM 100 MG PO TABS: 100.0000 mg | ORAL_TABLET | Freq: Every day | ORAL | 3 refills | Status: DC

## 2023-11-18 NOTE — Progress Notes (Signed)
Cardiology Office Note:    Date:  11/18/2023   ID:  Luis Harrington, DOB Apr 13, 1966, MRN [id number]  PCP:  Georganna Skeans, MD   [facility] Providers Cardiologist:  Maisie Fus, MD     Referring MD: Georganna Skeans, MD   No chief complaint on file. HTN  History of Present Illness:    Luis Harrington is a 58 y.o. male with a hx of PE, HTN, ECG 06/2022-NSR, , NSR, LAE, LAD, LVH, IRBBB referral to cardiology for HTN. Blood pressures 238/143 mmHg with gout.  Blood pressure today 189/119. He notes his blood pressure has been high for over a year. No prior cardiac hx.  Smokes cigarettes 3 per day. Family Hx: hypertension. No headaches, blurry vision, no Lh, no dizziness.   Interim hx 11/18/2023 Luis Harrington comes in today because he ran out of his blood pressure medications; hydrochlorothiazide and losartan.  His blood pressure is elevated and he is concerned.  He states he stopped taking carvedilol altogether.     Past Medical History:  Diagnosis Date   DVT (deep venous thrombosis) (HCC)    Gout    Hypertension    Pulmonary embolism (HCC)     Past Surgical History:  Procedure Laterality Date   TENDON REPAIR     Finger    Current Medications: Current Meds  Medication Sig   allopurinol (ZYLOPRIM) 100 MG tablet Take 1 tablet (100 mg total) by mouth daily.   amLODipine (NORVASC) 10 MG tablet Take 1 tablet by mouth once daily   apixaban (ELIQUIS) 5 MG TABS tablet Take 1 tablet (5 mg total) by mouth 2 (two) times daily.   atorvastatin (LIPITOR) 20 MG tablet TAKE 1 TABLET(20 MG) BY MOUTH DAILY   carvedilol (COREG) 25 MG tablet Take 1 tablet (25 mg total) by mouth 2 (two) times daily.   colchicine 0.6 MG tablet Take 1 tablet (0.6 mg total) by mouth 2 (two) times daily.   hydrochlorothiazide (HYDRODIURIL) 25 MG tablet Take 1 tablet (25 mg total) by mouth daily.   losartan (COZAAR) 100 MG tablet Take 1 tablet (100 mg total) by mouth daily.   tadalafil (CIALIS) 10 MG tablet Take  1 tablet 1/2 hour to 1 hour prior to intercourse as needed. Limit use to 1/2 tablet or 1 tablet per 24 hours.     Allergies:   Shrimp [shellfish allergy]   Social History   Socioeconomic History   Marital status: Married    Spouse name: Not on file   Number of children: Not on file   Years of education: Not on file   Highest education level: Not on file  Occupational History   Not on file  Tobacco Use   Smoking status: Some Days    Types: Cigarettes    Passive exposure: Current   Smokeless tobacco: Never  Vaping Use   Vaping status: Never Used  Substance and Sexual Activity   Alcohol use: Yes   Drug use: No   Sexual activity: Yes    Partners: Female  Other Topics Concern   Not on file  Social History Narrative   Not on file   Social Drivers of Health   Financial Resource Strain: Not on file  Food Insecurity: Not on file  Transportation Needs: Not on file  Physical Activity: Not on file  Stress: Not on file  Social Connections: Unknown (03/06/2022)   Received from [facility]   Social Network    Social Network: Not on file  Family History: The patient's family history includes Diabetes in his father and mother; Hypertension in his father and mother.  ROS:   Please see the history of present illness.     All other systems reviewed and are negative.  EKGs/Labs/Other Studies Reviewed:    The following studies were reviewed today:   EKG:  EKG is  ordered today.  The ekg ordered today demonstrates   08/26/2022- sinus bradycardia with 1st AV block, LAE, LAD, IRBBB Recent Labs: No results found for requested labs within last 365 days.   Recent Lipid Panel    Component Value Date/Time   CHOL 231 (H) 12/30/2020 1048   TRIG 149 12/30/2020 1048   HDL 36 (L) 12/30/2020 1048   CHOLHDL 6.4 (H) 12/30/2020 1048   CHOLHDL 6 04/06/2018 0855   VLDL 27.8 04/06/2018 0855   LDLCALC 168 (H) 12/30/2020 1048       Physical Exam:    VS:    Vitals:   11/18/23 0846  BP: (!) 158/112  Pulse: 70     Wt Readings from Last 3 Encounters:  11/18/23 252 lb (114.3 kg)  06/12/23 240 lb (108.9 kg)  01/27/23 240 lb (108.9 kg)     GEN:  Well nourished, well developed in no acute distress HEENT: Normal LYMPHATICS: No lymphadenopathy CARDIAC: RRR, no murmurs, rubs, gallops RESPIRATORY:  Clear to auscultation without rales, wheezing or rhonchi  ABDOMEN: Soft, non-tender, non-distended MUSCULOSKELETAL:  No edema; No deformity  SKIN: Warm and dry NEUROLOGIC:  Alert and oriented x 3 PSYCHIATRIC:  Normal affect   ASSESSMENT:   Hypertensive heart disease:  -continue losartan 100 mg daily, HCTz 25 mg daily, and norvasc 10 mg daily. continue carvedilol 25 mg BID [he states he is not taking this].  Renal ultrasound was normal.  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 S/p eliquis 5 mg BID.  Dipso    In order of problems listed above:  Pharmacy visit in 2 weeks HTN BMET in 2 weeks Follow up with an APP in 6 months     Medication Adjustments/Labs and Tests Ordered: Current medicines are reviewed at length with the patient today.  Concerns regarding medicines are outlined above.  Orders Placed This Encounter  Procedures   EKG 12-Lead   No orders of the defined types were placed in this encounter.   There are no Patient Instructions on file for this visit.   Signed, Maisie Fus, MD  11/18/2023 8:55 AM    [facility]

## 2023-11-18 NOTE — Patient Instructions (Signed)
Medication Instructions:  No changes  *If you need a refill on your cardiac medications before your next appointment, please call your pharmacy*   Lab Work: Bmet in 2 weeks  If you have labs (blood work) drawn today and your tests are completely normal, you will receive your results only by: MyChart Message (if you have MyChart) OR A paper copy in the mail If you have any lab test that is abnormal or we need to change your treatment, we will call you to review the results.   Testing/Procedures: None    Follow-Up: At [facility], you and your health needs are our priority.  As part of our continuing mission to provide you with exceptional heart care, we have created designated Provider Care Teams.  These Care Teams include your primary Cardiologist (physician) and Advanced Practice Providers (APPs -  Physician Assistants and Nurse Practitioners) who all work together to provide you with the care you need, when you need it.     Your next appointment:   6 months with any APP  Provider:   Carolan Clines   Other Instructions

## 2023-11-19 ENCOUNTER — Other Ambulatory Visit: Payer: Self-pay | Admitting: Family Medicine

## 2023-11-24 ENCOUNTER — Ambulatory Visit: Payer: BC Managed Care – PPO | Attending: Cardiovascular Disease

## 2023-11-24 NOTE — Progress Notes (Deleted)
 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 male patient of Dr Carolan Clines, in the office today for hypertension evaluation.   Blood Pressure Goal:  130/80  Current Medications:  losartan 100 mg every day, hydrochlorothiazide 25 mg every day, amlodipine 10 mg every day, carvedilol 25 mg bid   Previously tried:    Family Hx:     Social Hx:      Tobacco:  Alcohol:  Caffeine: Diet:      Exercise:   Home BP readings:      Adherence Assessment  Do you ever forget to take your medication? [] Yes [] No  Do you ever skip doses due to side effects? [] Yes [] No  Do you have trouble affording your medicines? [] Yes [] No  Are you ever unable to pick up your medication due to transportation difficulties? [] Yes [] No  Do you ever stop taking your medications because you don't believe they are helping? [] Yes [] No  Do you check your weight daily? [] Yes [] No   Adherence strategy: ***  Barriers to obtaining medications: ***     Accessory Clinical Findings    Lab Results  Component Value Date   CREATININE 1.61 (H) 07/12/2022   BUN 10 07/12/2022   NA 139 07/12/2022   K 4.4 07/12/2022   CL 103 07/12/2022   CO2 26 07/12/2022   Lab Results  Component Value Date   ALT 22 07/12/2022   AST 27 07/12/2022   ALKPHOS 53 07/12/2022   BILITOT 1.7 (H) 07/12/2022   Lab Results  Component Value Date   HGBA1C 5.7 04/06/2018    Home Medications    Current Outpatient Medications  Medication Sig Dispense Refill   allopurinol (ZYLOPRIM) 100 MG tablet Take 1 tablet (100 mg total) by mouth daily. 90  tablet 0   amLODipine (NORVASC) 10 MG tablet Take 1 tablet by mouth once daily 90 tablet 0   apixaban (ELIQUIS) 5 MG TABS tablet Take 1 tablet (5 mg total) by mouth 2 (two) times daily. 180 tablet 0   atorvastatin (LIPITOR) 20 MG tablet TAKE 1 TABLET(20 MG) BY MOUTH DAILY 30 tablet 2   carvedilol (COREG) 25 MG tablet Take 1 tablet (25 mg total) by mouth 2 (two) times daily. 180 tablet 0   colchicine 0.6 MG tablet Take 1 tablet (0.6 mg total) by mouth 2 (two) times daily. 30 tablet 0   hydrochlorothiazide (HYDRODIURIL) 25 MG tablet Take 1 tablet (25 mg total) by mouth daily. 90 tablet 3   losartan (COZAAR) 100 MG tablet Take 1 tablet (100 mg total) by mouth daily. 90 tablet 3   tadalafil (CIALIS) 10 MG tablet Take 1 tablet 1/2 hour to 1 hour prior to intercourse as needed. Limit use to 1/2 tablet or 1 tablet per 24 hours. 30 tablet 1   No current facility-administered medications for this visit.     No BP recorded.  {Refresh Note OR Click here to enter BP  :1}***   Assessment & Plan    No problem-specific Assessment & Plan notes  found for this encounter.   Phillips Hay PharmD CPP [facility]  [address] [place], [place] [address] [phone]

## 2023-12-10 ENCOUNTER — Institutional Professional Consult (permissible substitution) (HOSPITAL_BASED_OUTPATIENT_CLINIC_OR_DEPARTMENT_OTHER): Payer: BC Managed Care – PPO | Admitting: Family

## 2023-12-27 ENCOUNTER — Other Ambulatory Visit: Payer: Self-pay

## 2024-01-03 DIAGNOSIS — R079 Chest pain, unspecified: Secondary | ICD-10-CM | POA: Diagnosis not present

## 2024-01-03 DIAGNOSIS — M79602 Pain in left arm: Secondary | ICD-10-CM | POA: Diagnosis not present

## 2024-01-03 DIAGNOSIS — Z87891 Personal history of nicotine dependence: Secondary | ICD-10-CM | POA: Diagnosis not present

## 2024-01-03 DIAGNOSIS — Z86711 Personal history of pulmonary embolism: Secondary | ICD-10-CM | POA: Diagnosis not present

## 2024-01-03 DIAGNOSIS — Z86718 Personal history of other venous thrombosis and embolism: Secondary | ICD-10-CM | POA: Diagnosis not present

## 2024-01-03 DIAGNOSIS — I4510 Unspecified right bundle-branch block: Secondary | ICD-10-CM | POA: Diagnosis not present

## 2024-01-03 DIAGNOSIS — Z79899 Other long term (current) drug therapy: Secondary | ICD-10-CM | POA: Diagnosis not present

## 2024-01-03 DIAGNOSIS — F17210 Nicotine dependence, cigarettes, uncomplicated: Secondary | ICD-10-CM | POA: Diagnosis not present

## 2024-01-03 DIAGNOSIS — R0789 Other chest pain: Secondary | ICD-10-CM | POA: Diagnosis not present

## 2024-01-06 ENCOUNTER — Other Ambulatory Visit: Payer: Self-pay

## 2024-01-10 ENCOUNTER — Encounter: Payer: Self-pay | Admitting: Emergency Medicine

## 2024-01-10 ENCOUNTER — Ambulatory Visit: Admission: EM | Admit: 2024-01-10 | Discharge: 2024-01-10 | Disposition: A | Discharge status: Home / Self Care

## 2024-01-10 ENCOUNTER — Ambulatory Visit (INDEPENDENT_AMBULATORY_CARE_PROVIDER_SITE_OTHER)

## 2024-01-10 DIAGNOSIS — M25562 Pain in left knee: Secondary | ICD-10-CM | POA: Insufficient documentation

## 2024-01-10 DIAGNOSIS — M25462 Effusion, left knee: Secondary | ICD-10-CM | POA: Diagnosis not present

## 2024-01-10 DIAGNOSIS — M1990 Unspecified osteoarthritis, unspecified site: Secondary | ICD-10-CM | POA: Diagnosis not present

## 2024-01-10 NOTE — Discharge Instructions (Addendum)
 Your initial read of xray appears negative(check my chart for official read) Rest,ice,elevate,wear ace wrap or neoprene knee brace for support/comfort. May take tylenol over the counter for pain, may use biofreeze or lidocaine gel topically for pain Please follow up with your PCP/orthopedics if left knee pain persists, would not take ibuprofen,aleve or colchicine until discuss recent lab results with PCP.

## 2024-01-10 NOTE — ED Provider Notes (Signed)
 [name]    CSN: [id number] Arrival date & time: 01/10/24  1108      History   Chief Complaint Chief Complaint  Patient presents with   Knee Pain    HPI Luis Harrington is a 58 y.o. male.   58 year old male pt, Luis Harrington, presents to urgent care for evaluation of left knee pain that started yesterday, no known injury, however went to sister's 60th birthday party,  danced,  also had some alcohol and was cooking for party. Took 2 colchine this am PTA, requesting xray and work note.  PMH Gout, HTN,DVT,PE  The history is provided by the patient. No language interpreter was used.    Past Medical History:  Diagnosis Date   DVT (deep venous thrombosis) (HCC)    Gout    Hypertension    Pulmonary embolism [facility])     Patient Active Problem List   Diagnosis Date Noted   Arthralgia of left knee 01/10/2024   Arthritis 01/10/2024   History of gout 12/26/2020   Hyperglycemia 04/07/2018   Dyslipidemia 04/07/2018   Gout 04/06/2018   Essential hypertension 04/06/2018    Past Surgical History:  Procedure Laterality Date   TENDON REPAIR     Finger       Home Medications    Prior to Admission medications   Medication Sig Start Date End Date Taking? Authorizing Provider  carvedilol (COREG) 25 MG tablet Take 1 tablet (25 mg total) by mouth 2 (two) times daily. 01/13/23  Yes Georganna Skeans, MD  hydrochlorothiazide (HYDRODIURIL) 25 MG tablet Take 1 tablet (25 mg total) by mouth daily. 11/18/23  Yes Maisie Fus, MD  losartan (COZAAR) 100 MG tablet Take 1 tablet (100 mg total) by mouth daily. 11/18/23  Yes Maisie Fus, MD  allopurinol (ZYLOPRIM) 100 MG tablet Take 1 tablet (100 mg total) by mouth daily. 06/14/23   Georganna Skeans, MD  amLODipine (NORVASC) 10 MG tablet Take 1 tablet by mouth once daily 07/05/23   Georganna Skeans, MD  apixaban (ELIQUIS) 5 MG TABS tablet Take 1 tablet (5 mg total) by mouth 2 (two) times daily. 07/08/23   Georganna Skeans, MD   atorvastatin (LIPITOR) 20 MG tablet TAKE 1 TABLET(20 MG) BY MOUTH DAILY 01/13/23   Georganna Skeans, MD  carvedilol (COREG) 25 MG tablet Take by mouth.    [provider]  colchicine 0.6 MG tablet Take 1 tablet (0.6 mg total) by mouth 2 (two) times daily. 06/12/23   Garrison, [place] N, FNP  hydrALAZINE (APRESOLINE) 10 MG tablet Take 10 mg by mouth.    [provider]  losartan (COZAAR) 25 MG tablet Take by mouth.    [provider]  tadalafil (CIALIS) 10 MG tablet Take 1 tablet 1/2 hour to 1 hour prior to intercourse as needed. Limit use to 1/2 tablet or 1 tablet per 24 hours. 12/01/22   Georganna Skeans, MD    Family History Family History  Problem Relation Age of Onset   Diabetes Mother    Hypertension Mother    Diabetes Father    Hypertension Father     Social History Social History   Tobacco Use   Smoking status: Some Days    Types: Cigarettes    Passive exposure: Current   Smokeless tobacco: Never  Vaping Use   Vaping status: Never Used  Substance Use Topics   Alcohol use: Yes   Drug use: No     Allergies   Shrimp [shellfish allergy]  Review of Systems Review of Systems  Constitutional:  Negative for fever.  Musculoskeletal:  Positive for arthralgias, gait problem and joint swelling. Negative for back pain, myalgias, neck pain and neck stiffness.  All other systems reviewed and are negative.    Physical Exam Triage Vital Signs ED Triage Vitals  Encounter Vitals Group     BP      Systolic BP Percentile      Diastolic BP Percentile      Pulse      Resp      Temp      Temp src      SpO2      Weight      Height      Head Circumference      Peak Flow      Pain Score      Pain Loc      Pain Education      Exclude from Growth Chart    No data found.  Updated Vital Signs BP (!) 146/92 (BP Location: Left Arm)   Pulse 73   Temp 98.7 F (37.1 C) (Oral)   Resp 18   Ht 5\' 11"  (1.803 m)   Wt 251 lb 15.8 oz (114.3 kg)   SpO2 98%    BMI 35.14 kg/m   Visual Acuity Right Eye Distance:   Left Eye Distance:   Bilateral Distance:    Right Eye Near:   Left Eye Near:    Bilateral Near:     Physical Exam Vitals and nursing note reviewed.  Constitutional:      General: He is not in acute distress.    Appearance: He is well-developed and well-groomed.  HENT:     Head: Normocephalic and atraumatic.  Eyes:     Conjunctiva/sclera: Conjunctivae normal.  Cardiovascular:     Rate and Rhythm: Normal rate.     Pulses: Normal pulses.          Dorsalis pedis pulses are 2+ on the right side and 2+ on the left side.     Heart sounds: No murmur heard. Pulmonary:     Effort: Pulmonary effort is normal. No respiratory distress.  Abdominal:     Palpations: Abdomen is soft.     Tenderness: There is no abdominal tenderness.  Musculoskeletal:        General: No swelling.     Cervical back: Neck supple.     Left knee: Swelling and bony tenderness present. No deformity, effusion, erythema, ecchymosis or lacerations.       Legs:     Comments: Pt has pain with ROM,but has FULL ROM of left knee,no erythema, +TTP, no warmth of joint  Skin:    General: Skin is warm and dry.     Capillary Refill: Capillary refill takes less than 2 seconds.     Comments: Skin intact  Neurological:     General: No focal deficit present.     Mental Status: He is alert and oriented to person, place, and time.     GCS: GCS eye subscore is 4. GCS verbal subscore is 5. GCS motor subscore is 6.     Gait: Gait abnormal.     Comments: Limping left leg to reduce weight bearing on left knee  Psychiatric:        Attention and Perception: Attention normal.        Mood and Affect: Mood normal.        Speech: Speech normal.  Behavior: Behavior normal. Behavior is cooperative.      UC Treatments / Results  Labs (all labs ordered are listed, but only abnormal results are displayed) Labs Reviewed - No data to display  EKG   Radiology DG Knee  Complete 4 Views Left Result Date: 01/10/2024 CLINICAL DATA:  Knee pain after dancing at a wedding last weekend. EXAM: LEFT KNEE - COMPLETE 4 VIEW COMPARISON:  None Available. FINDINGS: No fracture or dislocation. Preserved joint spaces and bone mineralization. Trace joint fluid. Mild vascular calcifications. IMPRESSION: Trace joint fluid.  Chronic changes. Electronically Signed   By: Karen Kays M.D.   On: 01/10/2024 14:42    Procedures Procedures (including critical care time)  Medications Ordered in UC Medications - No data to display  Initial Impression / Assessment and Plan / UC Course  I have reviewed the triage vital signs and the nursing notes.  Pertinent labs & imaging results that were available during my care of the patient were reviewed by me and considered in my medical decision making (see chart for details).  Clinical Course as of 01/10/24 1448  Mon Jan 10, 2024  1229 Knee xray(unknown if injured dancing over the weekend) [JD]  1326 Wet read is negative, discussed exam findings and plan of care with pt(check my chart for results).  [JD]    Clinical Course User Index [JD] Jaivyn Gulla, Para March, NP  Discussed exam findings and plan of care with patient, strict go to ER precautions given.   Patient verbalized understanding to this provider.  Ddx: Left knee pain, arthritis, gout,sprain Final Clinical Impressions(s) / UC Diagnoses   Final diagnoses:  Acute pain of left knee  Arthritis     Discharge Instructions      Your initial read of xray appears negative(check my chart for official read) Rest,ice,elevate,wear ace wrap or neoprene knee brace for support/comfort. May take tylenol over the counter for pain, may use biofreeze or lidocaine gel topically for pain Please follow up with your PCP/orthopedics if left knee pain persists, would not take ibuprofen,aleve or colchicine until discuss recent lab results with PCP.      ED Prescriptions   None    PDMP not  reviewed this encounter.   Clancy Gourd, NP 01/10/24 [phone]

## 2024-01-10 NOTE — ED Triage Notes (Signed)
 Pt presents with knee pain that onset yesterday. Pt says he went to a wedding on Saturday and he was dancing so he isn't sure if that or the drinking is what is causing the pain. Pt states there has been no injuries.

## 2024-01-20 ENCOUNTER — Encounter: Payer: Self-pay | Admitting: Family Medicine

## 2024-01-20 ENCOUNTER — Ambulatory Visit: Payer: Self-pay | Admitting: Family Medicine

## 2024-01-20 VITALS — BP 159/106 | HR 80 | Temp 98.5°F | Resp 16 | Ht 71.0 in | Wt 239.8 lb

## 2024-01-20 DIAGNOSIS — R899 Unspecified abnormal finding in specimens from other organs, systems and tissues: Secondary | ICD-10-CM | POA: Diagnosis not present

## 2024-01-20 DIAGNOSIS — R195 Other fecal abnormalities: Secondary | ICD-10-CM | POA: Diagnosis not present

## 2024-01-20 DIAGNOSIS — I10 Essential (primary) hypertension: Secondary | ICD-10-CM

## 2024-01-20 DIAGNOSIS — N1832 Chronic kidney disease, stage 3b: Secondary | ICD-10-CM | POA: Diagnosis not present

## 2024-01-20 NOTE — Progress Notes (Signed)
 Established Patient Office Visit  Subjective    Patient ID: Luis Harrington, male    DOB: 1966-04-01  Age: 58 y.o. MRN: [id number]  CC:  Chief Complaint  Patient presents with   follow ER visit    Talk about lab results about his kidneys    HPI Luis Harrington presents for follow up of hypertension as well as abnormal lab results. Patient denies acute complaints.   Outpatient Encounter Medications as of 01/20/2024  Medication Sig   allopurinol (ZYLOPRIM) 100 MG tablet Take 1 tablet (100 mg total) by mouth daily.   amLODipine (NORVASC) 10 MG tablet Take 1 tablet by mouth once daily   apixaban (ELIQUIS) 5 MG TABS tablet Take 1 tablet (5 mg total) by mouth 2 (two) times daily.   atorvastatin (LIPITOR) 20 MG tablet TAKE 1 TABLET(20 MG) BY MOUTH DAILY   carvedilol (COREG) 25 MG tablet Take 1 tablet (25 mg total) by mouth 2 (two) times daily.   carvedilol (COREG) 25 MG tablet Take by mouth.   colchicine 0.6 MG tablet Take 1 tablet (0.6 mg total) by mouth 2 (two) times daily.   hydrALAZINE (APRESOLINE) 10 MG tablet Take 10 mg by mouth.   hydrochlorothiazide (HYDRODIURIL) 25 MG tablet Take 1 tablet (25 mg total) by mouth daily.   losartan (COZAAR) 100 MG tablet Take 1 tablet (100 mg total) by mouth daily.   losartan (COZAAR) 25 MG tablet Take by mouth.   tadalafil (CIALIS) 10 MG tablet Take 1 tablet 1/2 hour to 1 hour prior to intercourse as needed. Limit use to 1/2 tablet or 1 tablet per 24 hours.   No facility-administered encounter medications on file as of 01/20/2024.    Past Medical History:  Diagnosis Date   DVT (deep venous thrombosis) (HCC)    Gout    Hypertension    Pulmonary embolism (HCC)     Past Surgical History:  Procedure Laterality Date   TENDON REPAIR     Finger    Family History  Problem Relation Age of Onset   Diabetes Mother    Hypertension Mother    Diabetes Father    Hypertension Father     Social History   Socioeconomic History   Marital status:  Married    Spouse name: Not on file   Number of children: Not on file   Years of education: Not on file   Highest education level: Not on file  Occupational History   Not on file  Tobacco Use   Smoking status: Some Days    Types: Cigarettes    Passive exposure: Current   Smokeless tobacco: Never  Vaping Use   Vaping status: Never Used  Substance and Sexual Activity   Alcohol use: Yes   Drug use: No   Sexual activity: Yes    Partners: Female  Other Topics Concern   Not on file  Social History Narrative   Not on file   Social Drivers of Health   Financial Resource Strain: Low Risk  (01/20/2024)   Overall Financial Resource Strain (CARDIA)    Difficulty of Paying Living Expenses: Not hard at all  Food Insecurity: No Food Insecurity (01/20/2024)   Hunger Vital Sign    Worried About Running Out of Food in the Last Year: Never true    Ran Out of Food in the Last Year: Never true  Transportation Needs: No Transportation Needs (01/20/2024)   PRAPARE - Administrator, Civil Service (Medical): No  Lack of Transportation (Non-Medical): No  Physical Activity: Insufficiently Active (01/20/2024)   Exercise Vital Sign    Days of Exercise per Week: 3 days    Minutes of Exercise per Session: 30 min  Stress: No Stress Concern Present (01/20/2024)   [employer] of Occupational Health - Occupational Stress Questionnaire    Feeling of Stress : Not at all  Social Connections: Moderately Integrated (01/20/2024)   Social Connection and Isolation Panel [NHANES]    Frequency of Communication with Friends and Family: More than three times a week    Frequency of Social Gatherings with Friends and Family: More than three times a week    Attends Religious Services: More than 4 times per year    Active Member of [employer] or Organizations: Yes    Attends Banker Meetings: 1 to 4 times per year    Marital Status: Separated  Intimate Partner Violence: Not At Risk (01/20/2024)    Humiliation, Afraid, Rape, and Kick questionnaire    Fear of Current or Ex-Partner: No    Emotionally Abused: No    Physically Abused: No    Sexually Abused: No    Review of Systems  All other systems reviewed and are negative.       Objective    BP (!) 159/106   Pulse 80   Temp 98.5 F (36.9 C) (Oral)   Resp 16   Ht 5\' 11"  (1.803 m)   Wt 239 lb 12.8 oz (108.8 kg)   SpO2 98%   BMI 33.45 kg/m   Physical Exam Vitals and nursing note reviewed.  Constitutional:      General: He is not in acute distress. Cardiovascular:     Rate and Rhythm: Normal rate and regular rhythm.  Pulmonary:     Effort: Pulmonary effort is normal.     Breath sounds: Normal breath sounds.  Abdominal:     Palpations: Abdomen is soft.     Tenderness: There is no abdominal tenderness.  Neurological:     General: No focal deficit present.     Mental Status: He is alert and oriented to person, place, and time.         Assessment & Plan:    1. Essential hypertension (Primary) Elevated readings. Discussed compliance. Patient did not attend his last scheduled follow up with consultant.   2. Abnormal laboratory test Discussed with patient   3. Positive colorectal cancer screening using Cologuard test Reiterated importance of follow up  - Ambulatory referral to Gastroenterology  4. Stage 3b chronic kidney disease (HCC) As noted on most recent lab results.    Return in about 6 months (around 07/22/2024) for follow up.   Tommie Raymond, MD

## 2024-02-02 ENCOUNTER — Encounter: Payer: Self-pay | Admitting: Internal Medicine

## 2024-02-12 ENCOUNTER — Other Ambulatory Visit (HOSPITAL_BASED_OUTPATIENT_CLINIC_OR_DEPARTMENT_OTHER): Payer: Self-pay

## 2024-02-15 DIAGNOSIS — M25562 Pain in left knee: Secondary | ICD-10-CM | POA: Diagnosis not present

## 2024-02-23 ENCOUNTER — Other Ambulatory Visit: Payer: Self-pay

## 2024-02-25 ENCOUNTER — Other Ambulatory Visit: Payer: Self-pay

## 2024-02-25 ENCOUNTER — Ambulatory Visit (AMBULATORY_SURGERY_CENTER)

## 2024-02-25 VITALS — Ht 71.0 in | Wt 235.0 lb

## 2024-02-25 DIAGNOSIS — Z1211 Encounter for screening for malignant neoplasm of colon: Secondary | ICD-10-CM

## 2024-02-25 MED ORDER — NA SULFATE-K SULFATE-MG SULF 17.5-3.13-1.6 GM/177ML PO SOLN
1.0000 | Freq: Once | ORAL | 0 refills | Status: AC
Start: 2024-02-25 — End: 2024-02-25

## 2024-02-25 NOTE — Progress Notes (Signed)
 Denies allergies to eggs or soy products. Denies complication of anesthesia or sedation. Denies use of weight loss medication. Denies use of O2.   Emmi instructions given for colonoscopy.

## 2024-03-03 ENCOUNTER — Encounter: Payer: Self-pay | Admitting: Internal Medicine

## 2024-03-07 DIAGNOSIS — M25562 Pain in left knee: Secondary | ICD-10-CM | POA: Diagnosis not present

## 2024-03-16 ENCOUNTER — Telehealth: Payer: Self-pay | Admitting: Internal Medicine

## 2024-03-16 NOTE — Telephone Encounter (Signed)
 Understand and agree  No charge

## 2024-03-16 NOTE — Telephone Encounter (Signed)
 Good morning Dr. Willy Harvest,   Patient wished to reschedule his colonoscopy for tomorrow 5/23. Patient stated today he started to have heart fluttering and does not feel good about continuing with colonoscopy and would like to see his cardiologist prior to proceeding. Patient has rescheduled for 7/11.   Thank you.

## 2024-03-17 ENCOUNTER — Encounter: Admitting: Internal Medicine

## 2024-03-29 ENCOUNTER — Encounter: Payer: Self-pay | Admitting: Adult Health

## 2024-04-21 ENCOUNTER — Encounter: Payer: Self-pay | Admitting: Family Medicine

## 2024-04-21 ENCOUNTER — Ambulatory Visit: Admitting: Family Medicine

## 2024-04-21 VITALS — BP 173/117 | HR 79 | Wt 232.8 lb

## 2024-04-21 DIAGNOSIS — I10 Essential (primary) hypertension: Secondary | ICD-10-CM

## 2024-04-21 MED ORDER — LOSARTAN POTASSIUM-HCTZ 100-25 MG PO TABS: 1.0000 | ORAL_TABLET | Freq: Every day | ORAL | 0 refills | Status: DC

## 2024-04-24 ENCOUNTER — Encounter: Payer: Self-pay | Admitting: Family Medicine

## 2024-04-24 NOTE — Progress Notes (Signed)
 Established Patient Office Visit  Subjective    Patient ID: Luis Harrington, male    DOB: 04/06/1966  Age: 58 y.o. MRN: [id number]  CC:  Chief Complaint  Patient presents with   Medical Management of Chronic Issues   Medication Refill    HPI Luis Harrington presents for follow up of hypertension. Patient has not been taking meds as recommended for a period of time. He denies acute complaints.   Outpatient Encounter Medications as of 04/21/2024  Medication Sig   carvedilol  (COREG ) 25 MG tablet Take 1 tablet (25 mg total) by mouth 2 (two) times daily.   hydrochlorothiazide  (HYDRODIURIL ) 25 MG tablet Take 1 tablet (25 mg total) by mouth daily.   losartan  (COZAAR ) 25 MG tablet Take by mouth.   losartan -hydrochlorothiazide  (HYZAAR) 100-25 MG tablet Take 1 tablet by mouth daily.   allopurinol  (ZYLOPRIM ) 100 MG tablet Take 1 tablet (100 mg total) by mouth daily.   atorvastatin  (LIPITOR) 20 MG tablet TAKE 1 TABLET(20 MG) BY MOUTH DAILY (Patient not taking: Reported on 02/25/2024)   carvedilol  (COREG ) 25 MG tablet Take by mouth.   colchicine  0.6 MG tablet Take 1 tablet (0.6 mg total) by mouth 2 (two) times daily.   hydrALAZINE  (APRESOLINE ) 10 MG tablet Take 10 mg by mouth.   losartan  (COZAAR ) 100 MG tablet Take 1 tablet (100 mg total) by mouth daily.   tadalafil  (CIALIS ) 10 MG tablet Take 1 tablet 1/2 hour to 1 hour prior to intercourse as needed. Limit use to 1/2 tablet or 1 tablet per 24 hours.   No facility-administered encounter medications on file as of 04/21/2024.    Past Medical History:  Diagnosis Date   Clotting disorder (HCC)    DVT (deep venous thrombosis) (HCC)    Gout    Hypertension    Pulmonary embolism (HCC)     Past Surgical History:  Procedure Laterality Date   TENDON REPAIR     Finger    Family History  Problem Relation Age of Onset   Diabetes Mother    Hypertension Mother    Diabetes Father    Hypertension Father    Colon cancer Neg Hx    Esophageal cancer  Neg Hx    Stomach cancer Neg Hx    Rectal cancer Neg Hx     Social History   Socioeconomic History   Marital status: Married    Spouse name: Not on file   Number of children: Not on file   Years of education: Not on file   Highest education level: Not on file  Occupational History   Not on file  Tobacco Use   Smoking status: Some Days    Types: Cigarettes    Passive exposure: Current   Smokeless tobacco: Never  Vaping Use   Vaping status: Never Used  Substance and Sexual Activity   Alcohol use: Yes    Comment: Socially   Drug use: No   Sexual activity: Yes    Partners: Female  Other Topics Concern   Not on file  Social History Narrative   Not on file   Social Drivers of Health   Financial Resource Strain: Low Risk  (01/20/2024)   Overall Financial Resource Strain (CARDIA)    Difficulty of Paying Living Expenses: Not hard at all  Food Insecurity: No Food Insecurity (01/20/2024)   Hunger Vital Sign    Worried About Running Out of Food in the Last Year: Never true    Ran Out of Food in the  Last Year: Never true  Transportation Needs: No Transportation Needs (01/20/2024)   PRAPARE - Administrator, Civil Service (Medical): No    Lack of Transportation (Non-Medical): No  Physical Activity: Insufficiently Active (01/20/2024)   Exercise Vital Sign    Days of Exercise per Week: 3 days    Minutes of Exercise per Session: 30 min  Stress: No Stress Concern Present (01/20/2024)   [employer] of Occupational Health - Occupational Stress Questionnaire    Feeling of Stress : Not at all  Social Connections: Moderately Integrated (01/20/2024)   Social Connection and Isolation Panel    Frequency of Communication with Friends and Family: More than three times a week    Frequency of Social Gatherings with Friends and Family: More than three times a week    Attends Religious Services: More than 4 times per year    Active Member of [employer] or Organizations: Yes     Attends Banker Meetings: 1 to 4 times per year    Marital Status: Separated  Intimate Partner Violence: Not At Risk (01/20/2024)   Humiliation, Afraid, Rape, and Kick questionnaire    Fear of Current or Ex-Partner: No    Emotionally Abused: No    Physically Abused: No    Sexually Abused: No    Review of Systems  All other systems reviewed and are negative.       Objective    BP (!) 173/117 (BP Location: Right Arm, Patient Position: Sitting, Cuff Size: Large)   Pulse 79   Wt 232 lb 12.8 oz (105.6 kg)   SpO2 98%   BMI 32.47 kg/m   Physical Exam Vitals and nursing note reviewed.  Constitutional:      General: He is not in acute distress.  Cardiovascular:     Rate and Rhythm: Normal rate and regular rhythm.  Pulmonary:     Effort: Pulmonary effort is normal.     Breath sounds: Normal breath sounds.  Abdominal:     Palpations: Abdomen is soft.     Tenderness: There is no abdominal tenderness.   Neurological:     General: No focal deficit present.     Mental Status: He is alert and oriented to person, place, and time.         Assessment & Plan:  1. Uncontrolled hypertension (Primary) Elevated readings. Discussed compliance. Meds refilled.     Return in about 2 weeks (around 05/05/2024) for follow up.   Tanda Raguel SQUIBB, MD

## 2024-05-04 NOTE — Progress Notes (Deleted)
 [facility] History and Physical   Primary Care Physician:  Tanda Bleacher, MD   Reason for Procedure:  Colon cancer screening  Plan:    Colonoscopy     HPI: Luis Harrington is a 58 y.o. male presenting for a screening colonoscopy exam.  The patient had one previously scheduled but felt like he was having some palpitations and was going to see his cardiologist.  Saw primary care with uncontrolled hypertension in late June.  He had not been adherent to his medication regimen.   Past Medical History:  Diagnosis Date   Clotting disorder (HCC)    DVT (deep venous thrombosis) (HCC)    Gout    Hypertension    Pulmonary embolism (HCC)     Past Surgical History:  Procedure Laterality Date   TENDON REPAIR     Finger    Prior to Admission medications   Medication Sig Start Date End Date Taking? Authorizing Provider  atorvastatin  (LIPITOR) 20 MG tablet TAKE 1 TABLET(20 MG) BY MOUTH DAILY Patient not taking: Reported on 02/25/2024 01/13/23   Tanda Bleacher, MD  carvedilol  (COREG ) 25 MG tablet Take 1 tablet (25 mg total) by mouth 2 (two) times daily. 01/13/23   Tanda Bleacher, MD  colchicine  0.6 MG tablet Take 1 tablet (0.6 mg total) by mouth 2 (two) times daily. 06/12/23   Dreama, [place]  N, FNP  hydrALAZINE  (APRESOLINE ) 10 MG tablet Take 10 mg by mouth.    [provider]  hydrochlorothiazide  (HYDRODIURIL ) 25 MG tablet Take 1 tablet (25 mg total) by mouth daily. 11/18/23   Alvan Ronal BRAVO, MD  losartan  (COZAAR ) 25 MG tablet Take by mouth.    [provider]  losartan -hydrochlorothiazide  (HYZAAR) 100-25 MG tablet Take 1 tablet by mouth daily. 04/21/24   Tanda Bleacher, MD  tadalafil  (CIALIS ) 10 MG tablet Take 1 tablet 1/2 hour to 1 hour prior to intercourse as needed. Limit use to 1/2 tablet or 1 tablet per 24 hours. 12/01/22   Tanda Bleacher, MD    Current Outpatient Medications  Medication Sig Dispense Refill   atorvastatin  (LIPITOR) 20 MG tablet TAKE 1  TABLET(20 MG) BY MOUTH DAILY (Patient not taking: Reported on 02/25/2024) 30 tablet 2   carvedilol  (COREG ) 25 MG tablet Take 1 tablet (25 mg total) by mouth 2 (two) times daily. 180 tablet 0   colchicine  0.6 MG tablet Take 1 tablet (0.6 mg total) by mouth 2 (two) times daily. 30 tablet 0   hydrALAZINE  (APRESOLINE ) 10 MG tablet Take 10 mg by mouth.     hydrochlorothiazide  (HYDRODIURIL ) 25 MG tablet Take 1 tablet (25 mg total) by mouth daily. 90 tablet 3   losartan  (COZAAR ) 25 MG tablet Take by mouth.     losartan -hydrochlorothiazide  (HYZAAR) 100-25 MG tablet Take 1 tablet by mouth daily. 90 tablet 0   tadalafil  (CIALIS ) 10 MG tablet Take 1 tablet 1/2 hour to 1 hour prior to intercourse as needed. Limit use to 1/2 tablet or 1 tablet per 24 hours. 30 tablet 1   No current facility-administered medications for this visit.    Allergies as of 05/05/2024 - Review Complete 04/24/2024  Allergen Reaction Noted   Shrimp [shellfish allergy] Itching 07/03/2012    Family History  Problem Relation Age of Onset   Diabetes Mother    Hypertension Mother    Diabetes Father    Hypertension Father    Colon cancer Neg Hx    Esophageal cancer Neg Hx    Stomach cancer Neg Hx  Rectal cancer Neg Hx     Social History   Socioeconomic History   Marital status: Married    Spouse name: Not on file   Number of children: Not on file   Years of education: Not on file   Highest education level: Not on file  Occupational History   Not on file  Tobacco Use   Smoking status: Some Days    Types: Cigarettes    Passive exposure: Current   Smokeless tobacco: Never  Vaping Use   Vaping status: Never Used  Substance and Sexual Activity   Alcohol use: Yes    Comment: Socially   Drug use: No   Sexual activity: Yes    Partners: Female  Other Topics Concern   Not on file  Social History Narrative   Not on file   Social Drivers of Health   Financial Resource Strain: Low Risk  (01/20/2024)   Overall  Financial Resource Strain (CARDIA)    Difficulty of Paying Living Expenses: Not hard at all  Food Insecurity: No Food Insecurity (01/20/2024)   Hunger Vital Sign    Worried About Running Out of Food in the Last Year: Never true    Ran Out of Food in the Last Year: Never true  Transportation Needs: No Transportation Needs (01/20/2024)   PRAPARE - Administrator, Civil Service (Medical): No    Lack of Transportation (Non-Medical): No  Physical Activity: Insufficiently Active (01/20/2024)   Exercise Vital Sign    Days of Exercise per Week: 3 days    Minutes of Exercise per Session: 30 min  Stress: No Stress Concern Present (01/20/2024)   [employer] of Occupational Health - Occupational Stress Questionnaire    Feeling of Stress : Not at all  Social Connections: Moderately Integrated (01/20/2024)   Social Connection and Isolation Panel    Frequency of Communication with Friends and Family: More than three times a week    Frequency of Social Gatherings with Friends and Family: More than three times a week    Attends Religious Services: More than 4 times per year    Active Member of [employer] or Organizations: Yes    Attends Banker Meetings: 1 to 4 times per year    Marital Status: Separated  Intimate Partner Violence: Not At Risk (01/20/2024)   Humiliation, Afraid, Rape, and Kick questionnaire    Fear of Current or Ex-Partner: No    Emotionally Abused: No    Physically Abused: No    Sexually Abused: No    Review of Systems: Positive for *** All other review of systems negative except as mentioned in the HPI.  Physical Exam: Vital signs There were no vitals taken for this visit.  General:   Alert,  Well-developed, well-nourished, pleasant and cooperative in NAD Lungs:  Clear throughout to auscultation.   Heart:  Regular rate and rhythm; no murmurs, clicks, rubs,  or gallops. Abdomen:  Soft, nontender and nondistended. Normal bowel sounds.   Neuro/Psych:   Alert and cooperative. Normal mood and affect. A and O x 3   @Elise Knobloch  CHARLENA Commander, MD, [facility] [phone] (pager) 05/04/2024 8:18 PM@

## 2024-05-05 ENCOUNTER — Telehealth: Payer: Self-pay | Admitting: Internal Medicine

## 2024-05-05 ENCOUNTER — Encounter: Admitting: Internal Medicine

## 2024-05-05 NOTE — Telephone Encounter (Signed)
 Good Afternoon Dr. Avram,    I called this patient at 1:30pm to see if he was coming for his procedure.  I could not leave a msg the mailbox was full.  I will NO SHOW this patient. [employer]

## 2024-07-17 ENCOUNTER — Other Ambulatory Visit: Payer: Self-pay | Admitting: Family Medicine

## 2024-07-21 ENCOUNTER — Encounter: Payer: Self-pay | Admitting: Family Medicine

## 2024-07-21 ENCOUNTER — Encounter (INDEPENDENT_AMBULATORY_CARE_PROVIDER_SITE_OTHER): Payer: Self-pay

## 2024-07-21 ENCOUNTER — Ambulatory Visit (INDEPENDENT_AMBULATORY_CARE_PROVIDER_SITE_OTHER): Admitting: Family Medicine

## 2024-07-21 VITALS — BP 113/77 | Ht 71.0 in | Wt 221.4 lb

## 2024-07-21 DIAGNOSIS — N529 Male erectile dysfunction, unspecified: Secondary | ICD-10-CM

## 2024-07-21 DIAGNOSIS — F17200 Nicotine dependence, unspecified, uncomplicated: Secondary | ICD-10-CM

## 2024-07-21 DIAGNOSIS — N1832 Chronic kidney disease, stage 3b: Secondary | ICD-10-CM

## 2024-07-21 DIAGNOSIS — I10 Essential (primary) hypertension: Secondary | ICD-10-CM

## 2024-07-21 MED ORDER — LOSARTAN POTASSIUM-HCTZ 100-25 MG PO TABS: 1.0000 | ORAL_TABLET | Freq: Every day | ORAL | 1 refills | Status: AC

## 2024-07-21 MED ORDER — TADALAFIL 10 MG PO TABS
ORAL_TABLET | ORAL | 1 refills | Status: AC
Start: 2024-07-21 — End: ?

## 2024-07-21 MED ORDER — CARVEDILOL 25 MG PO TABS: 25.0000 mg | ORAL_TABLET | Freq: Two times a day (BID) | ORAL | 1 refills | Status: AC

## 2024-07-21 NOTE — Progress Notes (Signed)
 Established Patient Office Visit  Subjective    Patient ID: Luis Harrington, male    DOB: [birth year]  Age: 58 y.o. MRN: [id number]  CC:  Chief Complaint  Patient presents with   Medical Management of Chronic Issues    HPI Rennie Naeem presents for follow up of hypertension. Patient reports med compliance and denies acute complaints.   Outpatient Encounter Medications as of 07/21/2024  Medication Sig   [DISCONTINUED] carvedilol  (COREG ) 25 MG tablet Take 1 tablet (25 mg total) by mouth 2 (two) times daily.   [DISCONTINUED] losartan -hydrochlorothiazide  (HYZAAR) 100-25 MG tablet Take 1 tablet by mouth once daily   [DISCONTINUED] tadalafil  (CIALIS ) 10 MG tablet Take 1 tablet 1/2 hour to 1 hour prior to intercourse as needed. Limit use to 1/2 tablet or 1 tablet per 24 hours.   atorvastatin  (LIPITOR) 20 MG tablet TAKE 1 TABLET(20 MG) BY MOUTH DAILY (Patient not taking: Reported on 02/25/2024)   carvedilol  (COREG ) 25 MG tablet Take 1 tablet (25 mg total) by mouth 2 (two) times daily.   colchicine  0.6 MG tablet Take 1 tablet (0.6 mg total) by mouth 2 (two) times daily.   hydrALAZINE  (APRESOLINE ) 10 MG tablet Take 10 mg by mouth.   losartan -hydrochlorothiazide  (HYZAAR) 100-25 MG tablet Take 1 tablet by mouth daily.   tadalafil  (CIALIS ) 10 MG tablet Take 1 tablet 1/2 hour to 1 hour prior to intercourse as needed. Limit use to 1/2 tablet or 1 tablet per 24 hours.   [DISCONTINUED] hydrochlorothiazide  (HYDRODIURIL ) 25 MG tablet Take 1 tablet (25 mg total) by mouth daily. (Patient not taking: Reported on 07/21/2024)   [DISCONTINUED] losartan  (COZAAR ) 25 MG tablet Take by mouth. (Patient not taking: Reported on 07/21/2024)   [DISCONTINUED] losartan -hydrochlorothiazide  (HYZAAR) 100-25 MG tablet Take 1 tablet by mouth daily.   No facility-administered encounter medications on file as of 07/21/2024.    Past Medical History:  Diagnosis Date   Clotting disorder    DVT (deep venous thrombosis) (HCC)     Gout    Hypertension    Pulmonary embolism (HCC)     Past Surgical History:  Procedure Laterality Date   TENDON REPAIR     Finger    Family History  Problem Relation Age of Onset   Diabetes Mother    Hypertension Mother    Diabetes Father    Hypertension Father    Colon cancer Neg Hx    Esophageal cancer Neg Hx    Stomach cancer Neg Hx    Rectal cancer Neg Hx     Social History   Socioeconomic History   Marital status: Married    Spouse name: Not on file   Number of children: Not on file   Years of education: Not on file   Highest education level: Not on file  Occupational History   Not on file  Tobacco Use   Smoking status: Some Days    Types: Cigarettes    Passive exposure: Current   Smokeless tobacco: Never  Vaping Use   Vaping status: Never Used  Substance and Sexual Activity   Alcohol use: Yes    Comment: Socially   Drug use: No   Sexual activity: Yes    Partners: Female  Other Topics Concern   Not on file  Social History Narrative   Not on file   Social Drivers of Health   Financial Resource Strain: Low Risk  (01/20/2024)   Overall Financial Resource Strain (CARDIA)    Difficulty of Paying Living Expenses: Not  hard at all  Food Insecurity: No Food Insecurity (01/20/2024)   Hunger Vital Sign    Worried About Running Out of Food in the Last Year: Never true    Ran Out of Food in the Last Year: Never true  Transportation Needs: No Transportation Needs (01/20/2024)   PRAPARE - Administrator, Civil Service (Medical): No    Lack of Transportation (Non-Medical): No  Physical Activity: Insufficiently Active (01/20/2024)   Exercise Vital Sign    Days of Exercise per Week: 3 days    Minutes of Exercise per Session: 30 min  Stress: No Stress Concern Present (01/20/2024)   [employer] of Occupational Health - Occupational Stress Questionnaire    Feeling of Stress : Not at all  Social Connections: Moderately Integrated (01/20/2024)   Social  Connection and Isolation Panel    Frequency of Communication with Friends and Family: More than three times a week    Frequency of Social Gatherings with Friends and Family: More than three times a week    Attends Religious Services: More than 4 times per year    Active Member of [employer] or Organizations: Yes    Attends Banker Meetings: 1 to 4 times per year    Marital Status: Separated  Intimate Partner Violence: Not At Risk (01/20/2024)   Humiliation, Afraid, Rape, and Kick questionnaire    Fear of Current or Ex-Partner: No    Emotionally Abused: No    Physically Abused: No    Sexually Abused: No    Review of Systems  All other systems reviewed and are negative.       Objective    BP 113/77   Ht 5' 11 (1.803 m)   Wt 221 lb 6.4 oz (100.4 kg)   BMI 30.88 kg/m   Physical Exam Vitals and nursing note reviewed.  Constitutional:      General: He is not in acute distress. Cardiovascular:     Rate and Rhythm: Normal rate and regular rhythm.  Pulmonary:     Effort: Pulmonary effort is normal.     Breath sounds: Normal breath sounds.  Abdominal:     Palpations: Abdomen is soft.     Tenderness: There is no abdominal tenderness.  Neurological:     General: No focal deficit present.     Mental Status: He is alert and oriented to person, place, and time.         Assessment & Plan:   Essential hypertension  Stage 3b chronic kidney disease (HCC)  Erectile dysfunction, unspecified erectile dysfunction type -     Tadalafil ; Take 1 tablet 1/2 hour to 1 hour prior to intercourse as needed. Limit use to 1/2 tablet or 1 tablet per 24 hours.  Dispense: 30 tablet; Refill: 1  Smoker  Other orders -     Losartan  Potassium-HCTZ; Take 1 tablet by mouth daily.  Dispense: 90 tablet; Refill: 1 -     Carvedilol ; Take 1 tablet (25 mg total) by mouth 2 (two) times daily.  Dispense: 180 tablet; Refill: 1     Return in about 6 months (around 01/18/2025) for physical.    Tanda Raguel SQUIBB, MD

## 2024-08-11 NOTE — Progress Notes (Signed)
 [name]                                          MRN: [id number]   08/11/2024   The [facility] Quality Team Specialist reviewed this patient medical record for the purposes of chart review for care gap closure. The following were reviewed: abstraction for care gap closure-controlling blood pressure.    [facility] Quality Team

## 2024-11-23 ENCOUNTER — Encounter: Payer: Self-pay | Admitting: Family Medicine

## 2025-01-26 ENCOUNTER — Encounter: Admitting: Family Medicine
